# Patient Record
Sex: Female | Born: 1945 | Race: White | Hispanic: No | Marital: Married | Smoking: Never smoker
Health system: Southern US, Community
[De-identification: ages and names within clinical notes are randomized; demographics above are authoritative.]

## PROBLEM LIST (undated history)

## (undated) DIAGNOSIS — M779 Enthesopathy, unspecified: Secondary | ICD-10-CM

## (undated) DIAGNOSIS — F419 Anxiety disorder, unspecified: Secondary | ICD-10-CM

## (undated) DIAGNOSIS — F329 Major depressive disorder, single episode, unspecified: Secondary | ICD-10-CM

## (undated) DIAGNOSIS — F32A Depression, unspecified: Secondary | ICD-10-CM

## (undated) DIAGNOSIS — E785 Hyperlipidemia, unspecified: Secondary | ICD-10-CM

## (undated) HISTORY — DX: Enthesopathy, unspecified: M77.9

## (undated) HISTORY — DX: Hyperlipidemia, unspecified: E78.5

## (undated) HISTORY — DX: Anxiety disorder, unspecified: F41.9

## (undated) HISTORY — DX: Depression, unspecified: F32.A

## (undated) HISTORY — DX: Major depressive disorder, single episode, unspecified: F32.9

---

## 2013-09-23 ENCOUNTER — Encounter: Payer: Self-pay | Admitting: Family Medicine

## 2013-09-23 ENCOUNTER — Ambulatory Visit (INDEPENDENT_AMBULATORY_CARE_PROVIDER_SITE_OTHER): Payer: Medicare Other | Admitting: Family Medicine

## 2013-09-23 VITALS — BP 137/86 | HR 77 | Ht 66.0 in | Wt 215.0 lb

## 2013-09-23 DIAGNOSIS — Z79899 Other long term (current) drug therapy: Secondary | ICD-10-CM | POA: Diagnosis not present

## 2013-09-23 DIAGNOSIS — M7662 Achilles tendinitis, left leg: Secondary | ICD-10-CM | POA: Insufficient documentation

## 2013-09-23 DIAGNOSIS — F411 Generalized anxiety disorder: Secondary | ICD-10-CM

## 2013-09-23 DIAGNOSIS — E785 Hyperlipidemia, unspecified: Secondary | ICD-10-CM | POA: Insufficient documentation

## 2013-09-23 DIAGNOSIS — Z5181 Encounter for therapeutic drug level monitoring: Secondary | ICD-10-CM | POA: Diagnosis not present

## 2013-09-23 DIAGNOSIS — M25569 Pain in unspecified knee: Secondary | ICD-10-CM

## 2013-09-23 DIAGNOSIS — F419 Anxiety disorder, unspecified: Secondary | ICD-10-CM

## 2013-09-23 DIAGNOSIS — M7660 Achilles tendinitis, unspecified leg: Secondary | ICD-10-CM | POA: Diagnosis not present

## 2013-09-23 DIAGNOSIS — M25561 Pain in right knee: Secondary | ICD-10-CM | POA: Insufficient documentation

## 2013-09-23 HISTORY — DX: Anxiety disorder, unspecified: F41.9

## 2013-09-23 HISTORY — DX: Hyperlipidemia, unspecified: E78.5

## 2013-09-23 MED ORDER — ALPRAZOLAM 0.25 MG PO TABS: 0.2500 mg | ORAL_TABLET | Freq: Two times a day (BID) | ORAL | Status: DC | PRN

## 2013-09-23 MED ORDER — SIMVASTATIN 40 MG PO TABS: 20.0000 mg | ORAL_TABLET | Freq: Every day | ORAL | Status: DC

## 2013-09-23 MED ORDER — PAROXETINE HCL 20 MG PO TABS: 20.0000 mg | ORAL_TABLET | Freq: Every day | ORAL | Status: DC

## 2013-09-23 NOTE — Progress Notes (Signed)
CC: Candace Williams is a 68 y.o. female is here for Establish Care and Medication Management   Subjective: HPI:  Very pleasant 68 year old here to establish care  Patient reports a history of severe anxiety that has been present for matter of years. She believes it's been extremely well controlled since starting Paxil years ago.  She will occasionally take a single dose of alprazolam if she is going out into large crowds, cooking for a party, or around the holidays. She denies any history of depression. She denies any thoughts wanting to harm herself or others while on the above medication.  She denies any difficulty with sleeping or any other mental disturbance other than the above.  She reports a history of hyperlipidemia has been on Zocor for years. She denies any known intolerance or side effects specifically myalgias or right upper quadrant pain.  It's been well over a year since cholesterol and liver enzymes were checked last per report  Complains of left heel pain that has been present for matter of months. Accompanied by swelling and tenderness to palpation behind the heel. It is worse the longer she is on her feet. It is improved with Aleve. Symptoms come and go to a mild to moderate degree on a weekly basis. No interventions as of yet denies any recent history of trauma  Complains of right knee pain it has been present for matter of years. Is described as stiffness and pain in is nonradiating mild to moderate in severity. Present on a daily basis. It is worse after periods of inactivity and slightly improves with activity. It is relieved 100% by Aleve. Is accompanied by mild swelling on the medial aspect of the knee. She denies any catching locking or giving way   Review of Systems - General ROS: negative for - chills, fever, night sweats, weight gain or weight loss Ophthalmic ROS: negative for - decreased vision Psychological ROS: negative for -  depression ENT ROS: negative for -  hearing change, nasal congestion, tinnitus or allergies Hematological and Lymphatic ROS: negative for - bleeding problems, bruising or swollen lymph nodes Breast ROS: negative Respiratory ROS: no cough, shortness of breath, or wheezing Cardiovascular ROS: no chest pain or dyspnea on exertion Gastrointestinal ROS: no abdominal pain, change in bowel habits, or black or bloody stools Genito-Urinary ROS: negative for - genital discharge, genital ulcers, incontinence or abnormal bleeding from genitals Musculoskeletal ROS: negative for - joint pain or muscle pain other than that described above Neurological ROS: negative for - headaches or memory loss Dermatological ROS: negative for lumps, mole changes, rash and skin lesion changes  Past Medical History  Diagnosis Date  . Anxiety 09/23/2013  . Hyperlipidemia 09/23/2013    No past surgical history on file. No family history on file.  History   Social History  . Marital Status: Married    Spouse Name: N/A    Number of Children: N/A  . Years of Education: N/A   Occupational History  . Not on file.   Social History Main Topics  . Smoking status: Not on file  . Smokeless tobacco: Not on file  . Alcohol Use: Not on file  . Drug Use: Not on file  . Sexual Activity: Not on file   Other Topics Concern  . Not on file   Social History Narrative  . No narrative on file     Objective: BP 137/86  Pulse 77  Ht 5\' 6"  (1.676 m)  Wt 215 lb (97.523 kg)  BMI  34.72 kg/m2  General: Alert and Oriented, No Acute Distress HEENT: Pupils equal, round, reactive to light. Conjunctivae clear.  Moist mucous membranes pharynx unremarkable Lungs: Clear to auscultation bilaterally, no wheezing/ronchi/rales.  Comfortable work of breathing. Good air movement. Cardiac: Regular rate and rhythm. Normal S1/S2.  No murmurs, rubs, nor gallops.   Extremities: No peripheral edema.  Strong peripheral pulses. Right knee exam shows full-strength and range of motion.  There is no swelling, redness, nor warmth overlying the knee.  No patellar crepitus. No patellar apprehension. No pain with palpation of the inferior patellar pole.  No pain or laxity with valgus nor varus stress. Anterior drawer is negative.No popliteal space tenderness or palpable mass. No lateral joint line tenderness to palpation, medial joint line tenderness to palpation reproduces patient's pain. Left ankle has full range of motion and strength to all planes of motion, there is moderate bony hypertrophy at the insertion of the Achilles tendon, palpation of dislocation reproduces her presenting pain. No pain with palpation of medial or lateral malleoli nor palpation of the Achilles tendon itself Mental Status: No depression, anxiety, nor agitation. Skin: Warm and dry.  Assessment & Plan: Candace Williams was seen today for establish care and medication management.  Diagnoses and associated orders for this visit:  Hyperlipidemia - Lipid panel - simvastatin (ZOCOR) 40 MG tablet; Take 0.5 tablets (20 mg total) by mouth at bedtime.  Anxiety - PARoxetine (PAXIL) 20 MG tablet; Take 1 tablet (20 mg total) by mouth daily. - ALPRAZolam (XANAX) 0.25 MG tablet; Take 1 tablet (0.25 mg total) by mouth 2 (two) times daily as needed for anxiety.  Encounter for monitoring statin therapy - Lipid panel - COMPLETE METABOLIC PANEL WITH GFR  Left Achilles tendinitis  Right knee pain    Hyperlipidemia: Clinically controlled due for lipid panel and liver enzymes Anxiety: Controlled continue Paxil and as needed Xanax Left Achilles tendinitis, discussed wearing loose fitting comfortable shoes, start some rehabilitation plan to be performed on a daily basis for the next month. Handout was provided. Offered meloxicam the patient politely declines she'll continue to use Aleve Right knee pain suggestive of osteoarthritis, offered meloxicam and politely declines and will continue on Aleve. Discussed that she could  see my partner in sports medicine for consideration of knee injection anytime in the future, she prefer to just treat with Aleve at this time.  Return in about 6 months (around 03/25/2014).

## 2013-09-24 LAB — COMPLETE METABOLIC PANEL WITH GFR
ALT: 11 U/L (ref 0–35)
AST: 15 U/L (ref 0–37)
Albumin: 4.4 g/dL (ref 3.5–5.2)
Alkaline Phosphatase: 57 U/L (ref 39–117)
BILIRUBIN TOTAL: 0.7 mg/dL (ref 0.2–1.2)
BUN: 14 mg/dL (ref 6–23)
CO2: 28 meq/L (ref 19–32)
Calcium: 9.6 mg/dL (ref 8.4–10.5)
Chloride: 102 mEq/L (ref 96–112)
Creat: 0.73 mg/dL (ref 0.50–1.10)
GFR, EST NON AFRICAN AMERICAN: 87 mL/min
GLUCOSE: 95 mg/dL (ref 70–99)
Potassium: 4.7 mEq/L (ref 3.5–5.3)
SODIUM: 141 meq/L (ref 135–145)
TOTAL PROTEIN: 7.4 g/dL (ref 6.0–8.3)

## 2013-09-24 LAB — LIPID PANEL
CHOLESTEROL: 173 mg/dL (ref 0–200)
HDL: 48 mg/dL (ref >39)
LDL Cholesterol: 97 mg/dL (ref 0–99)
Total CHOL/HDL Ratio: 3.6 Ratio
Triglycerides: 142 mg/dL (ref <150)
VLDL: 28 mg/dL (ref 0–40)

## 2014-09-12 ENCOUNTER — Other Ambulatory Visit: Payer: Self-pay | Admitting: Family Medicine

## 2014-09-12 NOTE — Telephone Encounter (Signed)
Patient needs appointment with Provider for any further refills. 

## 2014-09-22 ENCOUNTER — Other Ambulatory Visit: Payer: Self-pay | Admitting: *Deleted

## 2014-09-22 DIAGNOSIS — E785 Hyperlipidemia, unspecified: Secondary | ICD-10-CM

## 2014-09-22 DIAGNOSIS — F419 Anxiety disorder, unspecified: Secondary | ICD-10-CM

## 2014-09-22 MED ORDER — SIMVASTATIN 40 MG PO TABS: 20.0000 mg | ORAL_TABLET | Freq: Every day | ORAL | Status: DC

## 2014-09-22 MED ORDER — PAROXETINE HCL 20 MG PO TABS: 20.0000 mg | ORAL_TABLET | Freq: Every day | ORAL | Status: DC

## 2014-10-27 ENCOUNTER — Encounter: Payer: Self-pay | Admitting: Family Medicine

## 2014-10-27 ENCOUNTER — Ambulatory Visit (INDEPENDENT_AMBULATORY_CARE_PROVIDER_SITE_OTHER): Payer: Medicare Other | Admitting: Family Medicine

## 2014-10-27 VITALS — BP 150/84 | HR 65 | Wt 220.0 lb

## 2014-10-27 DIAGNOSIS — I10 Essential (primary) hypertension: Secondary | ICD-10-CM | POA: Diagnosis not present

## 2014-10-27 DIAGNOSIS — F419 Anxiety disorder, unspecified: Secondary | ICD-10-CM | POA: Diagnosis not present

## 2014-10-27 DIAGNOSIS — E785 Hyperlipidemia, unspecified: Secondary | ICD-10-CM | POA: Diagnosis not present

## 2014-10-27 LAB — COMPLETE METABOLIC PANEL WITH GFR
ALK PHOS: 51 U/L (ref 33–130)
ALT: 13 U/L (ref 6–29)
AST: 15 U/L (ref 10–35)
Albumin: 4.4 g/dL (ref 3.6–5.1)
BILIRUBIN TOTAL: 0.6 mg/dL (ref 0.2–1.2)
BUN: 13 mg/dL (ref 7–25)
CO2: 27 mmol/L (ref 20–31)
Calcium: 9.5 mg/dL (ref 8.6–10.4)
Chloride: 102 mmol/L (ref 98–110)
Creat: 0.76 mg/dL (ref 0.50–0.99)
GFR, EST NON AFRICAN AMERICAN: 82 mL/min (ref >=60)
GFR, Est African American: >89 mL/min (ref >=60)
Glucose, Bld: 98 mg/dL (ref 65–99)
POTASSIUM: 4.4 mmol/L (ref 3.5–5.3)
SODIUM: 142 mmol/L (ref 135–146)
TOTAL PROTEIN: 7.1 g/dL (ref 6.1–8.1)

## 2014-10-27 LAB — LIPID PANEL
CHOL/HDL RATIO: 3 ratio (ref <=5.0)
CHOLESTEROL: 154 mg/dL (ref 125–200)
HDL: 52 mg/dL (ref >=46)
LDL CALC: 70 mg/dL (ref <130)
Triglycerides: 160 mg/dL — ABNORMAL HIGH (ref <150)
VLDL: 32 mg/dL — AB (ref <30)

## 2014-10-27 MED ORDER — ALPRAZOLAM 0.25 MG PO TABS: 0.2500 mg | ORAL_TABLET | Freq: Two times a day (BID) | ORAL | Status: DC | PRN

## 2014-10-27 MED ORDER — PAROXETINE HCL 20 MG PO TABS: 20.0000 mg | ORAL_TABLET | Freq: Every day | ORAL | Status: DC

## 2014-10-27 NOTE — Patient Instructions (Signed)
Buy a blood pressure cuff and if BP remains above 140/90 follow up as soon as possible.  Reduce salt intake as much as possible.

## 2014-10-27 NOTE — Progress Notes (Signed)
CC: Candace Williams is a 69 y.o. female is here for f/u chol and Medication Refill   Subjective: HPI:  Follow-up hyperlipidemia: Continues to take Zocor on a daily basis. No right upper quadrant pain or myalgias. Denies chest pain shortness breath orthopnea nor peripheral edema no formal exercise routine.  Follow-up anxiety: Continues to take Paxil a daily basis. A few days a month she will have to take Xanax if she is particularly anxious. Symptoms are worsened by having the planned major event such as a wedding or wedding receptions. Fortunately she does not have to do this in the future now that her daughter is married. Denies any depression. Overall she is happy with her current anti-anxiety regimen. No thoughts of harm self or others or depression. Denies any other mental disturbance  Blood pressure today is stage I hypertension range. She had pizza last night and does not monitor any sodium in her diet. She tells me she also add salt to some meals. She's never had elevated blood pressures in the past other than prehypertensive reading when I saw her last year.   Review Of Systems Outlined In HPI  Past Medical History  Diagnosis Date  . Anxiety 09/23/2013  . Hyperlipidemia 09/23/2013  . Depression     No past surgical history on file. Family History  Problem Relation Age of Onset  . Throat cancer Father     deceased  . Depression    . Hypertension Mother   . Thyroid disease Mother     History   Social History  . Marital Status: Married    Spouse Name: N/A  . Number of Children: N/A  . Years of Education: N/A   Occupational History  . Not on file.   Social History Main Topics  . Smoking status: Never Smoker   . Smokeless tobacco: Not on file  . Alcohol Use: 0.5 oz/week    1 drink(s) per week  . Drug Use: Not on file  . Sexual Activity: Yes   Other Topics Concern  . Not on file   Social History Narrative     Objective: BP 150/84 mmHg  Pulse 65  Wt 220 lb  (99.791 kg)  General: Alert and Oriented, No Acute Distress HEENT: Pupils equal, round, reactive to light. Conjunctivae clear.  Moist mucous membranes pharynx unremarkable Lungs: Clear to auscultation bilaterally, no wheezing/ronchi/rales.  Comfortable work of breathing. Good air movement. Cardiac: Regular rate and rhythm. Normal S1/S2.  No murmurs, rubs, nor gallops.   Extremities: No peripheral edema.  Strong peripheral pulses.  Mental Status: No depression, anxiety, nor agitation. Skin: Warm and dry.  Assessment & Plan: Candace Williams was seen today for f/u chol and medication refill.  Diagnoses and all orders for this visit:  Hyperlipidemia Orders: -     Lipid panel -     COMPLETE METABOLIC PANEL WITH GFR  Anxiety Orders: -     ALPRAZolam (XANAX) 0.25 MG tablet; Take 1 tablet (0.25 mg total) by mouth 2 (two) times daily as needed for anxiety. -     PARoxetine (PAXIL) 20 MG tablet; Take 1 tablet (20 mg total) by mouth daily.  Essential hypertension   Hyperlipidemia: Clinically controlled due for repeat lipid panel, continue simvastatin pending lipid panel and liver function Anxiety: Controlled continue Paxil and as needed alprazolam Essential hypertension: Discussed new diagnosis with her today and the importance of sodium restriction. Encouraged to also buy a blood pressure cuff and check blood pressures at home. Follow up as soon  as possible if blood pressures remain above 140/90  Return for 1-3 months for CPE.

## 2014-10-28 ENCOUNTER — Telehealth: Payer: Self-pay | Admitting: Family Medicine

## 2014-10-28 DIAGNOSIS — E785 Hyperlipidemia, unspecified: Secondary | ICD-10-CM

## 2014-10-28 MED ORDER — SIMVASTATIN 40 MG PO TABS: 20.0000 mg | ORAL_TABLET | Freq: Every day | ORAL | Status: DC

## 2014-10-28 NOTE — Telephone Encounter (Signed)
Sue Lush, Will you please let patient know that her LDL cholesterol remains well controlled therefore I'll send refills of her simvastatin to her [facility].  Triglycerides were barely elevated. This can be improved with increasing fish and whole grains in the diet.  I'd recommend f/u in six months.

## 2014-10-28 NOTE — Telephone Encounter (Signed)
Pt.notified

## 2015-06-05 ENCOUNTER — Encounter: Payer: Self-pay | Admitting: *Deleted

## 2015-07-07 ENCOUNTER — Other Ambulatory Visit: Payer: Self-pay | Admitting: Family Medicine

## 2015-07-13 ENCOUNTER — Other Ambulatory Visit: Payer: Self-pay

## 2015-07-13 MED ORDER — SIMVASTATIN 40 MG PO TABS: 20.0000 mg | ORAL_TABLET | Freq: Every day | ORAL | Status: DC

## 2015-07-14 ENCOUNTER — Telehealth: Payer: Self-pay | Admitting: Family Medicine

## 2015-07-14 NOTE — Telephone Encounter (Signed)
Called pt and left a message for pt to call and schedule f/u on Cholesterol and meds

## 2015-10-16 ENCOUNTER — Encounter: Payer: Self-pay | Admitting: Family Medicine

## 2015-10-16 ENCOUNTER — Ambulatory Visit (INDEPENDENT_AMBULATORY_CARE_PROVIDER_SITE_OTHER): Payer: Medicare Other | Admitting: Family Medicine

## 2015-10-16 VITALS — BP 157/83 | HR 71 | Wt 222.0 lb

## 2015-10-16 DIAGNOSIS — R030 Elevated blood-pressure reading, without diagnosis of hypertension: Secondary | ICD-10-CM | POA: Diagnosis not present

## 2015-10-16 DIAGNOSIS — F419 Anxiety disorder, unspecified: Secondary | ICD-10-CM | POA: Diagnosis not present

## 2015-10-16 DIAGNOSIS — IMO0001 Reserved for inherently not codable concepts without codable children: Secondary | ICD-10-CM

## 2015-10-16 DIAGNOSIS — E785 Hyperlipidemia, unspecified: Secondary | ICD-10-CM | POA: Diagnosis not present

## 2015-10-16 MED ORDER — PAROXETINE HCL 20 MG PO TABS: 20.0000 mg | ORAL_TABLET | Freq: Every day | ORAL | Status: DC

## 2015-10-16 MED ORDER — ALPRAZOLAM 0.25 MG PO TABS: 0.2500 mg | ORAL_TABLET | Freq: Two times a day (BID) | ORAL | Status: DC | PRN

## 2015-10-16 NOTE — Progress Notes (Signed)
CC: Jerlean Peralta is a 70 y.o. female is here for Medication Refill   Subjective: HPI:  Follow-up anxiety: She takes alprazolam 1-2 times a day. She is taking Paxil on a daily basis. She tells me she is anxious about coming to the doctor's office but overall her anxiety is controlled outside of our office. She denies any depression or any other mental disturbance.    Follow-up hyperlipidemia: She is requesting a refill on Zocor. She denies any right upper quadrant pain or myalgias. No chest pain shortness of breath orthopnea nor peripheral edema.  She would like some guidance on what vitamins she should take for general health.   Review Of Systems Outlined In HPI  Past Medical History  Diagnosis Date  . Anxiety 09/23/2013  . Hyperlipidemia 09/23/2013  . Depression     No past surgical history on file. Family History  Problem Relation Age of Onset  . Throat cancer Father     deceased  . Depression    . Hypertension Mother   . Thyroid disease Mother     Social History   Social History  . Marital Status: Married    Spouse Name: N/A  . Number of Children: N/A  . Years of Education: N/A   Occupational History  . Not on file.   Social History Main Topics  . Smoking status: Never Smoker   . Smokeless tobacco: Not on file  . Alcohol Use: 0.5 oz/week    1 drink(s) per week  . Drug Use: Not on file  . Sexual Activity: Yes   Other Topics Concern  . Not on file   Social History Narrative     Objective: BP 157/83 mmHg  Pulse 71  Wt 222 lb (100.699 kg)  General: Alert and Oriented, No Acute Distress HEENT: Pupils equal, round, reactive to light. Conjunctivae clear.  Moist mucous membranes Lungs: Clear to auscultation bilaterally, no wheezing/ronchi/rales.  Comfortable work of breathing. Good air movement. Cardiac: Regular rate and rhythm. Normal S1/S2.  No murmurs, rubs, nor gallops.   Extremities: No peripheral edema.  Strong peripheral pulses.  Mental Status:  No depression, anxiety, nor agitation. Skin: Warm and dry.  Assessment & Plan: Nohelia was seen today for medication refill.  Diagnoses and all orders for this visit:  White coat hypertension  Hyperlipidemia -     Lipid panel  Anxiety -     ALPRAZolam (XANAX) 0.25 MG tablet; Take 1 tablet (0.25 mg total) by mouth 2 (two) times daily as needed for anxiety. -     PARoxetine (PAXIL) 20 MG tablet; Take 1 tablet (20 mg total) by mouth daily.  Other orders -     Cancel: simvastatin (ZOCOR) 40 MG tablet; Take 0.5 tablets (20 mg total) by mouth at bedtime. NEED FOLLOW UP APPOINTMENT FOR MORE REFILLS   Hyperlipidemia: Due for lipid panel refills on Zocor will be needed based on results. Anxiety: Controlled with fluoxetine and Xanax. Whitecoat hypertension: Blood pressures have been normal at home we will consider this whitecoat hypertension and less pressures rise above 140/90 at home.  25 minutes spent face-to-face during visit today of which at least 50% was counseling or coordinating care regarding: 1. White coat hypertension   2. Hyperlipidemia   3. Anxiety      Discussed with this patient that I will be resigning from my position here with [facility] in September in order to stay with my family who will be moving to [facility]. I let him know  about the providers that are still accepting patients and I feel that this individual will be under great care if he/she stays here with [facility].   Return in about 3 months (around 01/16/2016) for Physical with Dr. Lyn HollingsheadAlexander.

## 2015-10-17 LAB — LIPID PANEL
Cholesterol: 163 mg/dL (ref 125–200)
HDL: 50 mg/dL (ref >=46)
LDL CALC: 87 mg/dL (ref <130)
Total CHOL/HDL Ratio: 3.3 Ratio (ref <=5.0)
Triglycerides: 128 mg/dL (ref <150)
VLDL: 26 mg/dL (ref <30)

## 2015-10-19 ENCOUNTER — Telehealth: Payer: Self-pay | Admitting: Family Medicine

## 2015-10-19 MED ORDER — SIMVASTATIN 40 MG PO TABS: 20.0000 mg | ORAL_TABLET | Freq: Every day | ORAL | 1 refills | Status: DC

## 2015-10-19 NOTE — Telephone Encounter (Signed)
Pt notified of recommendations

## 2015-10-19 NOTE — Telephone Encounter (Signed)
Will you please let patient know that her cholesterol is well-controlled with her current dose of simvastatin. I sent a refill to [facility] and would recommend follow-up in 6 months.

## 2016-04-22 ENCOUNTER — Ambulatory Visit (INDEPENDENT_AMBULATORY_CARE_PROVIDER_SITE_OTHER): Payer: Medicare Other | Admitting: Osteopathic Medicine

## 2016-04-22 ENCOUNTER — Encounter: Payer: Self-pay | Admitting: Osteopathic Medicine

## 2016-04-22 VITALS — BP 158/89 | HR 70 | Ht 64.0 in | Wt 223.0 lb

## 2016-04-22 DIAGNOSIS — F419 Anxiety disorder, unspecified: Secondary | ICD-10-CM | POA: Diagnosis not present

## 2016-04-22 DIAGNOSIS — E785 Hyperlipidemia, unspecified: Secondary | ICD-10-CM

## 2016-04-22 DIAGNOSIS — R030 Elevated blood-pressure reading, without diagnosis of hypertension: Secondary | ICD-10-CM

## 2016-04-22 DIAGNOSIS — Z23 Encounter for immunization: Secondary | ICD-10-CM

## 2016-04-22 MED ORDER — ZOSTER VAC RECOMB ADJUVANTED 50 MCG/0.5ML IM SUSR: 0.5000 mL | Freq: Once | INTRAMUSCULAR | 1 refills | Status: AC

## 2016-04-22 MED ORDER — PAROXETINE HCL 20 MG PO TABS: 20.0000 mg | ORAL_TABLET | Freq: Every day | ORAL | 3 refills | Status: DC

## 2016-04-22 NOTE — Progress Notes (Signed)
HPI: Candace Williams is a 71 y.o. female  who presents to [facility] today, 04/22/16,  for chief complaint of:  Chief Complaint  Patient presents with  . Establish Care    Previous PCP has left the practice, patient is here to establish care. No complaints today. Needs refills on some medications.  Anxiety: Has been doing well on Paxil for many years. Has some questions about whether or not this is something that she can or should discontinue at some point. See below for anxiety and depression self reporting skills.  Hypertension: History of hypertension on problem list. White coat hypertension more likely. No chest pain or  pressure, no shortness of breath or dizziness. Unknown if her when home blood pressure cuff has been verified.  Hyperlipidemia: Has sufficient amount of simvastatin. Lipid panel last John 10/16/2015, excellently 6 months ago  Past medical history, surgical history, social history and family history reviewed.  Patient Active Problem List   Diagnosis Date Noted  . White coat hypertension 10/16/2015  . Essential hypertension 10/27/2014  . Hyperlipidemia 09/23/2013  . Anxiety 09/23/2013  . Left Achilles tendinitis 09/23/2013  . Right knee pain 09/23/2013    Current medication list and allergy/intolerance information reviewed.   Current Outpatient Prescriptions on File Prior to Visit  Medication Sig Dispense Refill  . ALPRAZolam (XANAX) 0.25 MG tablet Take 1 tablet (0.25 mg total) by mouth 2 (two) times daily as needed for anxiety. 60 tablet 2  . PARoxetine (PAXIL) 20 MG tablet Take 1 tablet (20 mg total) by mouth daily. 90 tablet 1  . simvastatin (ZOCOR) 40 MG tablet Take 0.5 tablets (20 mg total) by mouth at bedtime. 90 tablet 1   No current facility-administered medications on file prior to visit.    No Known Allergies    Review of Systems:  Constitutional: No recent illness  HEENT: No  headache, no vision  change  Cardiac: No  chest pain, No  pressure, No palpitations  Respiratory:  No  shortness of breath. No  Cough  Gastrointestinal: No  abdominal pain, no change on bowel habits  Musculoskeletal: No new myalgia/arthralgia  Skin: No  Rash  Hem/Onc: No  easy bruising/bleeding, No  abnormal lumps/bumps  Neurologic: No  weakness, No  Dizziness  Psychiatric: No  concerns with depression, No  concerns with anxiety  Exam:  BP (!) 158/89   Pulse 70   Ht 5\' 4"  (1.626 m)   Wt 223 lb (101.2 kg)   BMI 38.28 kg/m   Constitutional: VS see above. General Appearance: alert, well-developed, well-nourished, NAD  Eyes: Normal lids and conjunctive, non-icteric sclera  Ears, Nose, Mouth, Throat: MMM, Normal external inspection ears/nares/mouth/lips/gums.  Neck: No masses, trachea midline.   Respiratory: Normal respiratory effort. no wheeze, no rhonchi, no rales  Cardiovascular: S1/S2 normal, no murmur, no rub/gallop auscultated. RRR.   Musculoskeletal: Gait normal. Symmetric and independent movement of all extremities  Neurological: Normal balance/coordination. No tremor.  Skin: warm, dry, intact.   Psychiatric: Normal judgment/insight. Normal mood and affect. Oriented x3.      GAD 7 : Generalized Anxiety Score 04/22/2016  Nervous, Anxious, on Edge 1  Control/stop worrying 1  Worry too much - different things 0  Trouble relaxing 0  Restless 0  Easily annoyed or irritable 2  Afraid - awful might happen 2  Total GAD 7 Score 6    Depression screen PHQ 2/9 04/22/2016  Decreased Interest 2  Down, Depressed, Hopeless 1  PHQ -  2 Score 3  Altered sleeping 2  Tired, decreased energy 2  Change in appetite 0  Feeling bad or failure about yourself  3  Trouble concentrating 0  Moving slowly or fidgety/restless 0  Suicidal thoughts 0  PHQ-9 Score 10      ASSESSMENT/PLAN:   Advised if doing better on SSRI than not, would advise continuation of this in light of overall risks  versus benefits, of course discontinuation is possible if this is her goal but if so would recommend slow taper under medical supervision. Patient verbalizes understanding and would like to stay on the medication for now  Anxiety - Plan: PARoxetine (PAXIL) 20 MG tablet  Hyperlipidemia, unspecified hyperlipidemia type  Need for shingles vaccine - Plan: Zoster Vac Recomb Adjuvanted (SHINGRIX) 50 MCG SUSR  White coat syndrome without diagnosis of hypertension      Follow-up plan: Return in about 6 months (around 10/20/2016) for [facility] VISIT - call the week ahead for labs/blood draw.  Visit summary with medication list and pertinent instructions was printed for patient to review, alert [place] if any changes needed. All questions at time of visit were answered - patient instructed to contact office with any additional concerns. ER/RTC precautions were reviewed with the patient and understanding verbalized.   Note: Total time spent 25 minutes, greater than 50% of the visit was spent face-to-face counseling and coordinating care for the following: The primary encounter diagnosis was Anxiety. 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

## 2016-04-28 ENCOUNTER — Telehealth: Payer: Self-pay | Admitting: Osteopathic Medicine

## 2016-04-28 NOTE — Telephone Encounter (Signed)
LM to inform pt of AWV schduled for July 26th at 1pm, [place]

## 2016-08-24 DIAGNOSIS — M722 Plantar fascial fibromatosis: Secondary | ICD-10-CM | POA: Diagnosis not present

## 2016-08-24 DIAGNOSIS — M24571 Contracture, right ankle: Secondary | ICD-10-CM | POA: Diagnosis not present

## 2016-08-24 DIAGNOSIS — M7661 Achilles tendinitis, right leg: Secondary | ICD-10-CM | POA: Diagnosis not present

## 2016-08-24 DIAGNOSIS — M7731 Calcaneal spur, right foot: Secondary | ICD-10-CM | POA: Diagnosis not present

## 2016-08-24 DIAGNOSIS — M7751 Other enthesopathy of right foot: Secondary | ICD-10-CM | POA: Diagnosis not present

## 2016-09-16 DIAGNOSIS — M7661 Achilles tendinitis, right leg: Secondary | ICD-10-CM | POA: Diagnosis not present

## 2016-09-16 DIAGNOSIS — M722 Plantar fascial fibromatosis: Secondary | ICD-10-CM | POA: Diagnosis not present

## 2016-09-16 DIAGNOSIS — M7751 Other enthesopathy of right foot: Secondary | ICD-10-CM | POA: Diagnosis not present

## 2016-09-16 DIAGNOSIS — M24571 Contracture, right ankle: Secondary | ICD-10-CM | POA: Diagnosis not present

## 2016-09-16 DIAGNOSIS — M7731 Calcaneal spur, right foot: Secondary | ICD-10-CM | POA: Diagnosis not present

## 2016-10-19 ENCOUNTER — Other Ambulatory Visit: Payer: Self-pay

## 2016-10-19 MED ORDER — SIMVASTATIN 40 MG PO TABS: 20.0000 mg | ORAL_TABLET | Freq: Every day | ORAL | 1 refills | Status: DC

## 2016-10-19 NOTE — Telephone Encounter (Signed)
Pt established care with you back in January.  You gave her a year supply of Paxil and only a 6 months supply for simvastatin.  She is wanting know can she get a 6 months supply sent to the pharmacy.  I explained to her that she is due for a wellness visit with lab work to make sure that she is on the appropriate dose of simvastatin.  She stated that she understood that but she is not financially stable to have a doctor visit at this time. Please advise. -EH/RMA

## 2016-10-20 ENCOUNTER — Ambulatory Visit: Payer: Self-pay

## 2016-11-18 DIAGNOSIS — M24571 Contracture, right ankle: Secondary | ICD-10-CM | POA: Diagnosis not present

## 2016-11-18 DIAGNOSIS — M7751 Other enthesopathy of right foot: Secondary | ICD-10-CM | POA: Diagnosis not present

## 2016-11-18 DIAGNOSIS — M7661 Achilles tendinitis, right leg: Secondary | ICD-10-CM | POA: Diagnosis not present

## 2016-11-18 DIAGNOSIS — M722 Plantar fascial fibromatosis: Secondary | ICD-10-CM | POA: Diagnosis not present

## 2016-11-18 DIAGNOSIS — M7731 Calcaneal spur, right foot: Secondary | ICD-10-CM | POA: Diagnosis not present

## 2016-12-13 DIAGNOSIS — M25571 Pain in right ankle and joints of right foot: Secondary | ICD-10-CM | POA: Diagnosis not present

## 2016-12-15 DIAGNOSIS — M25571 Pain in right ankle and joints of right foot: Secondary | ICD-10-CM | POA: Diagnosis not present

## 2016-12-26 DIAGNOSIS — M25571 Pain in right ankle and joints of right foot: Secondary | ICD-10-CM | POA: Diagnosis not present

## 2016-12-29 DIAGNOSIS — M25571 Pain in right ankle and joints of right foot: Secondary | ICD-10-CM | POA: Diagnosis not present

## 2017-06-21 ENCOUNTER — Ambulatory Visit (INDEPENDENT_AMBULATORY_CARE_PROVIDER_SITE_OTHER): Payer: Medicare Other | Admitting: Osteopathic Medicine

## 2017-06-21 ENCOUNTER — Encounter: Payer: Self-pay | Admitting: Osteopathic Medicine

## 2017-06-21 VITALS — BP 153/87 | HR 67 | Temp 98.2°F | Wt 227.0 lb

## 2017-06-21 DIAGNOSIS — Z1211 Encounter for screening for malignant neoplasm of colon: Secondary | ICD-10-CM

## 2017-06-21 DIAGNOSIS — E7800 Pure hypercholesterolemia, unspecified: Secondary | ICD-10-CM

## 2017-06-21 DIAGNOSIS — R7301 Impaired fasting glucose: Secondary | ICD-10-CM | POA: Diagnosis not present

## 2017-06-21 DIAGNOSIS — M1990 Unspecified osteoarthritis, unspecified site: Secondary | ICD-10-CM | POA: Diagnosis not present

## 2017-06-21 DIAGNOSIS — R635 Abnormal weight gain: Secondary | ICD-10-CM

## 2017-06-21 DIAGNOSIS — F419 Anxiety disorder, unspecified: Secondary | ICD-10-CM | POA: Diagnosis not present

## 2017-06-21 DIAGNOSIS — R030 Elevated blood-pressure reading, without diagnosis of hypertension: Secondary | ICD-10-CM

## 2017-06-21 MED ORDER — ALPRAZOLAM 0.25 MG PO TABS: 0.2500 mg | ORAL_TABLET | Freq: Two times a day (BID) | ORAL | 0 refills | Status: DC | PRN

## 2017-06-21 MED ORDER — PAROXETINE HCL 20 MG PO TABS: 20.0000 mg | ORAL_TABLET | Freq: Every day | ORAL | 3 refills | Status: DC

## 2017-06-21 MED ORDER — MELOXICAM 15 MG PO TABS: 7.5000 mg | ORAL_TABLET | Freq: Every day | ORAL | 3 refills | Status: DC

## 2017-06-21 NOTE — Progress Notes (Signed)
HPI: Candace Williams is a 72 y.o. female who  has a past medical history of Anxiety (09/23/2013), Depression, and Hyperlipidemia (09/23/2013).  Cand[facility] today, 06/21/17,  for chief complaint of:  Anxiety  Sopped her medications and is having some mood problems. Noticing significant increased irritability, difficulty with sleep.  White coat syndrome: Blood pressure is consistently high in office. Patient reports like to get back on antianxiety medications and see if it improves.  Concerned about weight gain, has questions about cardiac risk factors and statin medications.  Concerned about pain in right heel/Achilles tendon area. Chronic issue, Candace Williams states previous x-rays have showed bone spurs. No pain in plantar fascia area. No recent injury.    Past medical history, surgical history, social history and family history reviewed. No updates needed.   Current medication list and allergy/intolerance information reviewed.    Current Outpatient Medications on File Prior to Visit  Medication Sig Dispense Refill  . ALPRAZolam (XANAX) 0.25 MG tablet Take 1 tablet (0.25 mg total) by mouth 2 (two) times daily as needed for anxiety. 60 tablet 2  . simvastatin (ZOCOR) 40 MG tablet Take 0.5 tablets (20 mg total) by mouth at bedtime. 90 tablet 1  . PARoxetine (PAXIL) 20 MG tablet Take 1 tablet (20 mg total) by mouth daily. (Patient not taking: Reported on 06/21/2017) 90 tablet 3   No current facility-administered medications on file prior to visit.    No Known Allergies    Review of Systems:  Constitutional: No recent illness  HEENT: No  headache, no vision change  Cardiac: No  chest pain, No  pressure, No palpitations  Respiratory:  No  shortness of breath. No  Cough  Gastrointestinal: No  abdominal pain, no change on bowel habits  Musculoskeletal: +new myalgia/arthralgia  Skin: No  Rash  Hem/Onc: No  easy bruising/bleeding, No   abnormal lumps/bumps  Neurologic: No  weakness, No  Dizziness  Psychiatric: +concerns with depression, +concerns with anxiety  Exam:  BP (!) 153/87 (BP Location: Left Arm, Patient Position: Sitting, Cuff Size: Normal)   Pulse 67   Temp 98.2 F (36.8 C) (Oral)   Wt 227 lb (103 kg)   BMI 38.96 kg/m   Constitutional: VS see above. General Appearance: alert, well-developed, well-nourished, NAD  Eyes: Normal lids and conjunctive, non-icteric sclera  Ears, Nose, Mouth, Throat: MMM, Normal external inspection ears/nares/mouth/lips/gums.  Neck: No masses, trachea midline.   Respiratory: Normal respiratory effort. no wheeze, no rhonchi, no rales  Cardiovascular: S1/S2 normal, no murmur, no rub/gallop auscultated. RRR.   Musculoskeletal: Gait normal. Symmetric and independent movement of all extremities. No LE edema   Neurological: Normal balance/coordination. No tremor.  Skin: warm, dry, intact.   Psychiatric: Normal judgment/insight. Normal mood and affect. Oriented x3.     ASSESSMENT/PLAN:   Anxiety - Plan: PARoxetine (PAXIL) 20 MG tablet, CBC, COMPLETE METABOLIC PANEL WITH GFR, ALPRAZolam (XANAX) 0.25 MG tablet  White coat syndrome without diagnosis of hypertension - Plan: CBC, COMPLETE METABOLIC PANEL WITH GFR, TSH  Arthritis  Weight gain - Plan: TSH  High cholesterol - Plan: Lipid panel  Colon cancer screening - Plan: Cologuard   Meds ordered this encounter  Medications  . PARoxetine (PAXIL) 20 MG tablet    Sig: Take 1 tablet (20 mg total) by mouth daily.    Dispense:  90 tablet    Refill:  3  . meloxicam (MOBIC) 15 MG tablet    Sig: Take 0.5-1 tablets (7.5-15  mg total) by mouth daily. As needed for aches/pain    Dispense:  30 tablet    Refill:  3  . ALPRAZolam (XANAX) 0.25 MG tablet    Sig: Take 1 tablet (0.25 mg total) by mouth 2 (two) times daily as needed for anxiety.    Dispense:  30 tablet    Refill:  0    Patient Instructions  Plan:  See  printed exercises for achilles/heel. If aches/pains are not better or if it gets worse, I would recommend follow-up with one of our sports medicine specialists (Dr Denyse Amass or Dr. Cherylann Parr Dr. Karie Schwalbe) for further evaluation in 2-4 weeks. Just call our office and ask for an appointment for sports medicine! Try the anti-inflammatory medication as needed   Let's restart the Paxil  Let's get blood work - we can talk more about your risk of heart issues or stroke once we have these results.      Follow-up plan: Return in about 1 month (around 07/19/2017) for follow-up on anxiety, review lab results, recheck BP .  Visit summary with medication list and pertinent instructions was printed for patient to review, alert [place] if any changes needed. All questions at time of visit were answered - patient instructed to contact office with any additional concerns. ER/RTC precautions were reviewed with the patient and understanding verbalized.   Note: Total time spent 40 minutes, greater than 50% of the visit was spent face-to-face counseling and coordinating care for the following: The primary encounter diagnosis was Anxiety. Diagnoses of White coat syndrome without diagnosis of hypertension, Arthritis, Weight gain, High cholesterol, and Colon cancer screening were also pertinent to this visit.Marland Kitchen  Please note: voice recognition software was used to produce this document, and typos may escape review. Please contact Dr. Lyn Hollingshead for any needed clarifications.

## 2017-06-21 NOTE — Patient Instructions (Addendum)
Plan:  See printed exercises for achilles/heel. If aches/pains are not better or if it gets worse, I would recommend follow-up with one of our sports medicine specialists (Dr Denyse Amassorey or Dr. Cherylann Parrhekkekandam aka Dr. Karie Schwalbe) for further evaluation in 2-4 weeks. Just call our office and ask for an appointment for sports medicine! Try the anti-inflammatory medication as needed   Let's restart the Paxil  Let's get blood work - we can talk more about your risk of heart issues or stroke once we have these results.

## 2017-06-22 ENCOUNTER — Encounter: Payer: Self-pay | Admitting: Osteopathic Medicine

## 2017-06-22 LAB — LIPID PANEL
CHOL/HDL RATIO: 3.2 (calc) (ref <5.0)
Cholesterol: 162 mg/dL (ref <200)
HDL: 50 mg/dL — AB (ref >50)
LDL Cholesterol (Calc): 88 mg/dL (calc)
NON-HDL CHOLESTEROL (CALC): 112 mg/dL (ref <130)
Triglycerides: 141 mg/dL (ref <150)

## 2017-06-22 LAB — COMPLETE METABOLIC PANEL WITH GFR
AG RATIO: 1.6 (calc) (ref 1.0–2.5)
ALT: 11 U/L (ref 6–29)
AST: 14 U/L (ref 10–35)
Albumin: 4.6 g/dL (ref 3.6–5.1)
Alkaline phosphatase (APISO): 60 U/L (ref 33–130)
BUN: 13 mg/dL (ref 7–25)
CALCIUM: 9.8 mg/dL (ref 8.6–10.4)
CO2: 30 mmol/L (ref 20–32)
Chloride: 103 mmol/L (ref 98–110)
Creat: 0.9 mg/dL (ref 0.50–0.99)
GFR, EST AFRICAN AMERICAN: 76 mL/min/{1.73_m2} (ref >=60)
GFR, EST NON AFRICAN AMERICAN: 65 mL/min/{1.73_m2} (ref >=60)
GLOBULIN: 2.9 g/dL (ref 1.9–3.7)
Glucose, Bld: 108 mg/dL — ABNORMAL HIGH (ref 65–99)
POTASSIUM: 4.1 mmol/L (ref 3.5–5.3)
Sodium: 140 mmol/L (ref 135–146)
Total Bilirubin: 0.7 mg/dL (ref 0.2–1.2)
Total Protein: 7.5 g/dL (ref 6.1–8.1)

## 2017-06-22 LAB — HEMOGLOBIN A1C W/OUT EAG: HEMOGLOBIN A1C: 5.7 %{Hb} — AB (ref <5.7)

## 2017-06-22 LAB — CBC
HEMATOCRIT: 42.9 % (ref 35.0–45.0)
HEMOGLOBIN: 14.7 g/dL (ref 11.7–15.5)
MCH: 29.3 pg (ref 27.0–33.0)
MCHC: 34.3 g/dL (ref 32.0–36.0)
MCV: 85.5 fL (ref 80.0–100.0)
MPV: 11.1 fL (ref 7.5–12.5)
Platelets: 214 10*3/uL (ref 140–400)
RBC: 5.02 10*6/uL (ref 3.80–5.10)
RDW: 13.3 % (ref 11.0–15.0)
WBC: 7.7 10*3/uL (ref 3.8–10.8)

## 2017-06-22 LAB — TSH: TSH: 2.55 m[IU]/L (ref 0.40–4.50)

## 2017-07-26 DIAGNOSIS — Z1212 Encounter for screening for malignant neoplasm of rectum: Secondary | ICD-10-CM | POA: Diagnosis not present

## 2017-07-26 DIAGNOSIS — Z1211 Encounter for screening for malignant neoplasm of colon: Secondary | ICD-10-CM | POA: Diagnosis not present

## 2017-08-09 LAB — COLOGUARD: COLOGUARD: NEGATIVE

## 2017-08-25 ENCOUNTER — Telehealth: Payer: Self-pay

## 2017-08-25 NOTE — Telephone Encounter (Signed)
I don't have any results on file, can we call Cologuard and make sure results were sent?

## 2017-08-25 NOTE — Telephone Encounter (Signed)
Contacted [facility] re: Cologuard results. As per Rep Kevin Fenton results for cologuard is negative from 08/09/17. Pt has been updated with results. No other inquiries asked during call.

## 2017-08-25 NOTE — Telephone Encounter (Signed)
Task completed. Pls review add'l telephone encounter.

## 2017-08-25 NOTE — Telephone Encounter (Signed)
Pt left vm msg requesting results for Cologuard. Pls advise, thanks.

## 2017-09-27 ENCOUNTER — Other Ambulatory Visit: Payer: Self-pay | Admitting: Osteopathic Medicine

## 2017-11-21 ENCOUNTER — Telehealth: Payer: Self-pay | Admitting: Osteopathic Medicine

## 2017-11-21 NOTE — Telephone Encounter (Signed)
Please call patient: Cologuard testing was negative.  Plan to repeat this in 3 years.  Documentation was signed and sent to scan

## 2017-11-21 NOTE — Telephone Encounter (Signed)
Pt has been updated.  

## 2017-11-21 NOTE — Telephone Encounter (Signed)
Left a brief vm msg for pt to return call back regarding results. Direct call back information provided.

## 2017-11-30 ENCOUNTER — Encounter: Payer: Self-pay | Admitting: Osteopathic Medicine

## 2018-06-08 ENCOUNTER — Other Ambulatory Visit: Payer: Self-pay | Admitting: Osteopathic Medicine

## 2018-06-08 DIAGNOSIS — F419 Anxiety disorder, unspecified: Secondary | ICD-10-CM

## 2018-06-08 NOTE — Telephone Encounter (Signed)
[facility] pharmacy requesting med refill for alprazolam, paroxetine and simvastatin.

## 2018-06-09 ENCOUNTER — Telehealth: Payer: Self-pay | Admitting: Osteopathic Medicine

## 2018-06-09 NOTE — Telephone Encounter (Signed)
I sent 30-day supply for requested medications, but it has been almost a year since she was last seen.  Needs to come in for an routine checkup sometime in the next month.

## 2018-06-11 ENCOUNTER — Telehealth: Payer: Self-pay

## 2018-06-11 NOTE — Telephone Encounter (Signed)
[facility] pharmacy requesting clarification on paroxetine rx:  "Pt states she used to take 1/2 tab daily. Dose was increased 11/2017, but no one contacted her to tell her why. Was this a mistake or is there a reason she needs to increased to one tablet daily?" Pls advise, thanks.

## 2018-06-11 NOTE — Telephone Encounter (Signed)
Pt has been updated of refills for multiple meds sent to local pharmacy. No other inquiries during call.

## 2018-06-11 NOTE — Telephone Encounter (Signed)
Pt has been updated. As per pt, she will continue to take 1/2 tab of paroxetine daily. Aware that annual follow up is needed. No other inquiries during call.

## 2018-06-11 NOTE — Telephone Encounter (Signed)
Pt advised.

## 2018-06-11 NOTE — Telephone Encounter (Signed)
Don't know  Paxil 20 mg is what's been in her chart - likely entered in error and patient did not review her med list closely to correct [place].  OK to take half Due for annual follow-up been alsmot a year since seen

## 2018-07-09 ENCOUNTER — Other Ambulatory Visit: Payer: Self-pay | Admitting: Osteopathic Medicine

## 2018-07-09 DIAGNOSIS — F419 Anxiety disorder, unspecified: Secondary | ICD-10-CM

## 2018-07-09 NOTE — Telephone Encounter (Signed)
Routing to Dr Alexander's rx refill pool 

## 2018-07-15 NOTE — Progress Notes (Signed)
Subjective:   Candace Williams is a 73 y.o. female who presents for an Initial Medicare Annual Wellness Visit.  Review of Systems    No ROS.  Medicare Wellness Visit. Additional risk factors are reflected in the social history.   Cardiac Risk Factors include: advanced age (>81men, >76 women);hypertension;dyslipidemia;sedentary lifestyle Sleep patterns: Getting 8 hpours of sleep a night. Wakes up 1 time to go to the bathroom. Wakes up feeling rested. Home Safety/Smoke Alarms: Feels safe in home. Smoke alarms in place.  Living environment; Lives with husband in a 2 story home. Stairs have hand rails on them. SHower is a walk in shower  And no grab bars in place Seat Belt Safety/Bike Helmet: Wears seat belt.   Female:   Pap- aged out      Mammo-    ordered   Dexa scan-    ordered    CCS- UTD    Objective:    Today's Vitals   07/16/18 1436  BP: 131/70  Pulse: 67  Weight: 219 lb (99.3 kg)  Height: 5\' 4"  (1.626 m)   Body mass index is 37.59 kg/m.  Advanced Directives 07/16/2018 09/23/2013  Does Patient Have a Medical Advance Directive? No Patient does not have advance directive;Patient would not like information  Would patient like information on creating a medical advance directive? No - Patient declined -    Current Medications (verified) Outpatient Encounter Medications as of 07/16/2018  Medication Sig  . ALPRAZolam (XANAX) 0.25 MG tablet Take 1 tablet by mouth twice daily as needed for anxiety  . meloxicam (MOBIC) 15 MG tablet Take 0.5-1 tablets (7.5-15 mg total) by mouth daily. As needed for aches/pain  . PARoxetine (PAXIL) 20 MG tablet Take 1 tablet by mouth once daily  . simvastatin (ZOCOR) 40 MG tablet TAKE 1 TABLET BY MOUTH ONCE DAILY AT  6  PM   No facility-administered encounter medications on file as of 07/16/2018.     Allergies (verified) Patient has no known allergies.   History: Past Medical History:  Diagnosis Date  . Anxiety 09/23/2013  . Depression    . Hyperlipidemia 09/23/2013  . Tendonitis    right foot   History reviewed. No pertinent surgical history. Family History  Problem Relation Age of Onset  . Throat cancer Father        deceased  . Depression Other   . Hypertension Mother   . Thyroid disease Mother    Social History   Socioeconomic History  . Marital status: Married    Spouse name: Candace Williams  . Number of children: 1  . Years of education: 25  . Highest education level: Bachelor's degree (e.g., BA, AB, BS)  Occupational History  . 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 Financial resource strain: Not hard at all  . Food insecurity:    Worry: Never true    Inability: Never true  . Transportation needs:    Medical: No    Non-medical: No  Tobacco Use  . Smoking status: Never Smoker  . Smokeless tobacco: Never Used  Substance and Sexual Activity  . Alcohol use: Yes    Alcohol/week: 0.0 standard drinks  . Drug use: Never  . Sexual activity: Not Currently  Lifestyle  . Physical activity:    Days per week: 0 days    Minutes per session: 0 min  . Stress: Not at all  Relationships  . Social connections:    Talks on phone: More than three  times a week    Gets together: Twice a week    Attends religious service: Never    Active member of club or organization: No    Attends meetings of clubs or organizations: Never    Relationship status: Married  Other Topics Concern  . 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 N  Vision? N  Comment had lasik surgery a long time ago  Difficulty concentrating or making decisions? N  Walking or climbing stairs? N  Dressing or bathing? N  Doing errands, shopping? N  Preparing Food and eating ? N  Using the Toilet? N  In the past six months, have you accidently leaked urine? Y  Comment does have urinary incontinence. Does empty bladder more often and this helps  Do you have problems with loss of bowel control? N  Managing your Medications? N  Managing your Finances? N  Housekeeping or managing your Housekeeping? N  Some recent data might be hidden     Immunizations and Health Maintenance  There is no immunization history on file for this patient. Health Maintenance Due  Topic Date Due  . Hepatitis C Screening  1945-05-19  . TETANUS/TDAP  01/10/1967  . MAMMOGRAM  01/09/1998  . DEXA SCAN  01/09/2013  . PNA vac Low Risk Adult (1 of 2 - PCV13) 01/09/2013    Patient Care Team: Sunnie Nielsen, DO as PCP - General (Osteopathic Medicine)  Indicate any recent Medical Services you may have received from other than [facility] providers in the past year (date may be approximate).     Assessment:   This is a routine wellness examination for [place].Physical assessment deferred to PCP.   Hearing/Vision screen Hearing Screening Comments: Hearing test not done- visit over the phone was done due to COVID19 pandemic Vision Screening Comments: Vison test not done- visit over the telephone due to the COVID19 pandemic  Dietary issues and exercise activities discussed: Current Exercise Habits: The patient does not participate in regular exercise at present, Exercise limited by: None identified Diet Eats a healthy diet with vegetables, fruits, trys to stay away from carbs. Eats a lot of  cheese Breakfast: leftovers or sandwich Lunch: combines with breakfast Dinner: Meat and 2 vegetables watches proteins portions, potato    Drinks water daily.  Goals    . Exercise 3x per week (30 min per time)     Would like to start using a stationary bike and building endurance on that.      Depression Screen PHQ 2/9 Scores 07/16/2018 04/22/2016  PHQ - 2 Score 0 3  PHQ- 9 Score - 10    Fall Risk Fall Risk  07/16/2018  Falls in the past year? 0  Follow up Falls prevention discussed    Is the patient's home free of loose throw rugs in walkways, pet beds, electrical cords,  etc?   yes      Grab bars in the bathroom? no      Handrails on the stairs?   yes      Adequate lighting?   yes  Cognitive Function:     6CIT Screen 07/16/2018  What Year? 0 points  What month? 0 points  What time? 0 points  Count back from 20 0 points  Months in reverse 0 points  Repeat phrase 0 points  Total Score 0    Screening Tests Health Maintenance  Topic Date Due  . Hepatitis C Screening  08/06/1945  . TETANUS/TDAP  01/10/1967  . MAMMOGRAM  01/09/1998  . DEXA SCAN  01/09/2013  . PNA vac Low Risk Adult (1 of 2 - PCV13) 01/09/2013  . INFLUENZA VACCINE  10/27/2018  . Fecal DNA (Cologuard)  08/09/2020       Plan:      Ms. Tvedt , Thank you for taking time to come for your Medicare Wellness Visit. I appreciate your ongoing commitment to your health goals. Please review the following plan we discussed and let me know if I can assist you in the future.    Please schedule your next medicare wellness visit with me in 1 yr.    These are the goals we discussed: Goals    . Exercise 3x per week (30 min per time)     Would like to start using a stationary bike and building endurance on that.       This is a list of the screening recommended for you and due dates:  Health Maintenance  Topic Date Due  .  Hepatitis C: One time screening is recommended by Center for Disease Control   (CDC) for  adults born from [birth year] through 1963.   1945-05-22  . Tetanus Vaccine  01/10/1967  . Mammogram  01/09/1998  . DEXA scan (bone density measurement)  01/09/2013  . Pneumonia vaccines (1 of 2 - PCV13) 01/09/2013  . Flu Shot  10/27/2018  . Cologuard (Stool DNA test)  08/09/2020      These are the goals we discussed: Goals    . Exercise 3x per week (30 min per time)     Would like to start using a stationary bike and building endurance on that.       This is a list of the screening recommended for you and due dates:  Health Maintenance  Topic Date Due  .  Hepatitis C: One time screening is recommended by Center for Disease Control  (CDC) for  adults born from [birth year] through 1963.   1945-06-22  . Tetanus Vaccine  01/10/1967  . Mammogram  01/09/1998  . DEXA scan (bone density measurement)  01/09/2013  . Pneumonia vaccines (1 of 2 - PCV13) 01/09/2013  . Flu Shot  10/27/2018  . Cologuard (Stool DNA test)  08/09/2020     These are the goals we discussed: Goals    . Exercise 3x per week (30 min per time)     Would like to start using a stationary bike and building endurance on that.       This is a list of the screening recommended for you and due dates:  Health Maintenance  Topic Date Due  .  Hepatitis C: One time screening is recommended by Center for Disease Control  (CDC) for  adults born from [birth year] through 1963.   [birth year]  . Tetanus Vaccine  01/10/1967  . Mammogram  01/09/1998  . DEXA scan (  bone density measurement)  01/09/2013  . Pneumonia vaccines (1 of 2 - PCV13) 01/09/2013  . Flu Shot  10/27/2018  . Cologuard (Stool DNA test)  08/09/2020     I have personally reviewed and noted the following in the patient's chart:   . Medical and social history . Use of alcohol, tobacco or illicit drugs  . Current medications and supplements . Functional ability and status . Nutritional status . Physical activity . Advanced directives . List of other  physicians . Hospitalizations, surgeries, and ER visits in previous 12 months . Vitals . Screenings to include cognitive, depression, and falls . Referrals and appointments  In addition, I have reviewed and discussed with patient certain preventive protocols, quality metrics, and best practice recommendations. A written personalized care plan for preventive services as well as general preventive health recommendations were provided to patient.     Normand SloopKimberly A Katurah Karapetian, LPN   1/61/09604/20/2020

## 2018-07-16 ENCOUNTER — Ambulatory Visit (INDEPENDENT_AMBULATORY_CARE_PROVIDER_SITE_OTHER): Payer: Medicare Other | Admitting: *Deleted

## 2018-07-16 VITALS — BP 131/70 | HR 67 | Ht 64.0 in | Wt 219.0 lb

## 2018-07-16 DIAGNOSIS — Z129 Encounter for screening for malignant neoplasm, site unspecified: Secondary | ICD-10-CM

## 2018-07-16 DIAGNOSIS — Z0000 Encounter for general adult medical examination without abnormal findings: Secondary | ICD-10-CM | POA: Diagnosis not present

## 2018-07-16 DIAGNOSIS — Z780 Asymptomatic menopausal state: Secondary | ICD-10-CM

## 2018-07-16 DIAGNOSIS — Z13820 Encounter for screening for osteoporosis: Secondary | ICD-10-CM

## 2018-07-16 DIAGNOSIS — Z1239 Encounter for other screening for malignant neoplasm of breast: Secondary | ICD-10-CM | POA: Diagnosis not present

## 2018-07-16 NOTE — Patient Instructions (Addendum)
Ms. Candace Williams , Thank you for taking time to come for your Medicare Wellness Visit. I appreciate your ongoing commitment to your health goals. Please review the following plan we discussed and let me know if I can assist you in the future.    Please schedule your next medicare wellness visit with me in 1 yr.  These are the goals we discussed: Goals    . Exercise 3x per week (30 min per time)     Would like to start using a stationary bike and building endurance on that.

## 2018-08-10 ENCOUNTER — Other Ambulatory Visit: Payer: Self-pay | Admitting: Osteopathic Medicine

## 2018-08-10 DIAGNOSIS — F419 Anxiety disorder, unspecified: Secondary | ICD-10-CM

## 2018-08-15 ENCOUNTER — Telehealth: Payer: Self-pay | Admitting: Osteopathic Medicine

## 2018-08-15 ENCOUNTER — Other Ambulatory Visit: Payer: Self-pay | Admitting: Osteopathic Medicine

## 2018-08-15 DIAGNOSIS — F419 Anxiety disorder, unspecified: Secondary | ICD-10-CM

## 2018-08-15 MED ORDER — PAROXETINE HCL 20 MG PO TABS: ORAL_TABLET | ORAL | 0 refills | Status: DC

## 2018-08-15 MED ORDER — SIMVASTATIN 40 MG PO TABS: ORAL_TABLET | ORAL | 0 refills | Status: DC

## 2018-08-15 NOTE — Telephone Encounter (Signed)
Pt is calling for all refills to be sent in to [facility] on file. Pt completed her medicare wellness last month. Routing to PCP to see when she is supposed to follow with PCP next, and how many refills we have provide based on the medicare wellness visit.

## 2018-08-15 NOTE — Telephone Encounter (Signed)
Pt advised. She will schedule in June with PCP. Refill sent for 30 day supply.

## 2018-08-15 NOTE — Telephone Encounter (Signed)
I haven't seen her since 05/2017, last labs around then, too. Given pandemic, I'm ok to hold off on follow-up with me, 6 mos refills ok, but will need to check in with me sometime in the next 3-6 months for physical exam and labs   BP Readings from Last 3 Encounters:  07/16/18 131/70  06/21/17 (!) 153/87  04/22/16 (!) 158/89

## 2018-10-29 ENCOUNTER — Other Ambulatory Visit: Payer: Self-pay

## 2018-11-27 ENCOUNTER — Telehealth: Payer: Self-pay

## 2018-11-27 DIAGNOSIS — Z13820 Encounter for screening for osteoporosis: Secondary | ICD-10-CM

## 2018-11-27 DIAGNOSIS — E7800 Pure hypercholesterolemia, unspecified: Secondary | ICD-10-CM

## 2018-11-27 DIAGNOSIS — Z0000 Encounter for general adult medical examination without abnormal findings: Secondary | ICD-10-CM

## 2018-11-27 DIAGNOSIS — Z780 Asymptomatic menopausal state: Secondary | ICD-10-CM

## 2018-11-27 DIAGNOSIS — R030 Elevated blood-pressure reading, without diagnosis of hypertension: Secondary | ICD-10-CM

## 2018-11-27 DIAGNOSIS — F419 Anxiety disorder, unspecified: Secondary | ICD-10-CM

## 2018-11-27 DIAGNOSIS — M1990 Unspecified osteoarthritis, unspecified site: Secondary | ICD-10-CM

## 2018-11-27 NOTE — Telephone Encounter (Signed)
Orders are in for labs, fasting at least 2 days prior to visit.   Can we also make sure her physical in a 40-minute for Medicare Wellness w/ Dr A (can't get done w/ Maudie Mercury)

## 2018-11-27 NOTE — Telephone Encounter (Signed)
Patient does not want a CPE at this time and declined. She has a med follow up next week. She is aware that labs are in to get them completed.

## 2018-11-27 NOTE — Telephone Encounter (Signed)
Pt called requesting annual labs. As per pt, she will be due for yearly appt and wants labs ahead of time. Pls advise, thanks.

## 2018-11-29 DIAGNOSIS — Z780 Asymptomatic menopausal state: Secondary | ICD-10-CM | POA: Diagnosis not present

## 2018-11-29 DIAGNOSIS — Z13820 Encounter for screening for osteoporosis: Secondary | ICD-10-CM | POA: Diagnosis not present

## 2018-11-29 DIAGNOSIS — R030 Elevated blood-pressure reading, without diagnosis of hypertension: Secondary | ICD-10-CM | POA: Diagnosis not present

## 2018-11-29 DIAGNOSIS — F419 Anxiety disorder, unspecified: Secondary | ICD-10-CM | POA: Diagnosis not present

## 2018-11-29 DIAGNOSIS — M1990 Unspecified osteoarthritis, unspecified site: Secondary | ICD-10-CM | POA: Diagnosis not present

## 2018-11-29 DIAGNOSIS — E7800 Pure hypercholesterolemia, unspecified: Secondary | ICD-10-CM | POA: Diagnosis not present

## 2018-11-29 DIAGNOSIS — Z0000 Encounter for general adult medical examination without abnormal findings: Secondary | ICD-10-CM | POA: Diagnosis not present

## 2018-11-29 LAB — CBC
HCT: 43.2 % (ref 35.0–45.0)
Hemoglobin: 14.5 g/dL (ref 11.7–15.5)
MCH: 30.4 pg (ref 27.0–33.0)
MCHC: 33.6 g/dL (ref 32.0–36.0)
MCV: 90.6 fL (ref 80.0–100.0)
MPV: 11 fL (ref 7.5–12.5)
Platelets: 202 10*3/uL (ref 140–400)
RBC: 4.77 10*6/uL (ref 3.80–5.10)
RDW: 13.8 % (ref 11.0–15.0)
WBC: 7.4 10*3/uL (ref 3.8–10.8)

## 2018-11-29 LAB — COMPLETE METABOLIC PANEL WITH GFR
AG Ratio: 1.6 (calc) (ref 1.0–2.5)
ALT: 12 U/L (ref 6–29)
AST: 16 U/L (ref 10–35)
Albumin: 4.4 g/dL (ref 3.6–5.1)
Alkaline phosphatase (APISO): 54 U/L (ref 37–153)
BUN: 12 mg/dL (ref 7–25)
CO2: 29 mmol/L (ref 20–32)
Calcium: 9.5 mg/dL (ref 8.6–10.4)
Chloride: 101 mmol/L (ref 98–110)
Creat: 0.89 mg/dL (ref 0.60–0.93)
GFR, Est African American: 76 mL/min/{1.73_m2} (ref >=60)
GFR, Est Non African American: 66 mL/min/{1.73_m2} (ref >=60)
Globulin: 2.7 g/dL (calc) (ref 1.9–3.7)
Glucose, Bld: 98 mg/dL (ref 65–99)
Potassium: 4.3 mmol/L (ref 3.5–5.3)
Sodium: 139 mmol/L (ref 135–146)
Total Bilirubin: 0.6 mg/dL (ref 0.2–1.2)
Total Protein: 7.1 g/dL (ref 6.1–8.1)

## 2018-11-29 LAB — LIPID PANEL
Cholesterol: 223 mg/dL — ABNORMAL HIGH (ref <200)
HDL: 47 mg/dL — ABNORMAL LOW (ref >=50)
LDL Cholesterol (Calc): 147 mg/dL (calc) — ABNORMAL HIGH
Non-HDL Cholesterol (Calc): 176 mg/dL (calc) — ABNORMAL HIGH (ref <130)
Total CHOL/HDL Ratio: 4.7 (calc) (ref <5.0)
Triglycerides: 159 mg/dL — ABNORMAL HIGH (ref <150)

## 2018-12-05 ENCOUNTER — Encounter: Payer: Self-pay | Admitting: Osteopathic Medicine

## 2018-12-05 ENCOUNTER — Ambulatory Visit (INDEPENDENT_AMBULATORY_CARE_PROVIDER_SITE_OTHER): Payer: Medicare Other | Admitting: Osteopathic Medicine

## 2018-12-05 VITALS — BP 129/69 | HR 63 | Temp 98.1°F | Wt 222.0 lb

## 2018-12-05 DIAGNOSIS — G8929 Other chronic pain: Secondary | ICD-10-CM

## 2018-12-05 DIAGNOSIS — M25562 Pain in left knee: Secondary | ICD-10-CM | POA: Diagnosis not present

## 2018-12-05 DIAGNOSIS — E7800 Pure hypercholesterolemia, unspecified: Secondary | ICD-10-CM | POA: Diagnosis not present

## 2018-12-05 DIAGNOSIS — F419 Anxiety disorder, unspecified: Secondary | ICD-10-CM

## 2018-12-05 MED ORDER — MELOXICAM 15 MG PO TABS: 7.5000 mg | ORAL_TABLET | Freq: Every day | ORAL | 3 refills | Status: DC

## 2018-12-05 MED ORDER — ALPRAZOLAM 0.25 MG PO TABS: 0.2500 mg | ORAL_TABLET | Freq: Two times a day (BID) | ORAL | 0 refills | Status: DC | PRN

## 2018-12-05 MED ORDER — PAROXETINE HCL 30 MG PO TABS: 30.0000 mg | ORAL_TABLET | Freq: Every day | ORAL | 3 refills | Status: DC

## 2018-12-05 MED ORDER — SIMVASTATIN 40 MG PO TABS: 20.0000 mg | ORAL_TABLET | Freq: Every day | ORAL | 1 refills | Status: DC

## 2018-12-05 NOTE — Progress Notes (Signed)
Virtual Visit via Video (App used: Doximity) Note  I connected with      Candace BouchardElizabeth Williams on 12/05/18 at 4:03 PM by a telemedicine application and verified that I am speaking with the correct person using two identifiers.  Patient is at home I am in office   I discussed the limitations of evaluation and management by telemedicine and the availability of in person appointments. The patient expressed understanding and agreed to proceed.  History of Present Illness: Candace Bouchardlizabeth Williams is a 73 y.o. female who would like to discuss medications refilled     Recent virtual visit d/t COVID pandemic  Pt requesting refills.   Cholesterol recently checked, she had not been taking the statin medication and this definitely made a difference in elevation of LDL.  Patient reports that the Paxil has overall been working pretty well for her but she has noticed an uptake and anxiety.  Inquires about increasing this medication.  Reports some issues with Achilles tendinitis and bracing of the knee which she thinks has caused some sort of lump to develop on that knee on the right side.  [facility] 2/9 07/16/2018 04/22/2016  Decreased Interest 0 2  Down, Depressed, Hopeless 0 1  PHQ - 2 Score 0 3  Altered sleeping - 2  Tired, decreased energy - 2  Change in appetite - 0  Feeling bad or failure about yourself  - 3  Trouble concentrating - 0  Moving slowly or fidgety/restless - 0  Suicidal thoughts - 0  PHQ-9 Score - 10    GAD 7 : Generalized Anxiety Score 04/22/2016  Nervous, Anxious, on Edge 1  Control/stop worrying 1  Worry too much - different things 0  Trouble relaxing 0  Restless 0  Easily annoyed or irritable 2  Afraid - awful might happen 2  Total GAD 7 Score 6         Observations/Objective: BP 129/69 (BP Location: Left Arm, Patient Position: Sitting, Cuff Size: Normal)   Pulse 63   Temp 98.1 F (36.7 C) (Oral)   Wt 222 lb (100.7 kg)   BMI 38.11 kg/m  BP  Readings from Last 3 Encounters:  12/05/18 129/69  07/16/18 131/70  06/21/17 (!) 153/87   Exam: Normal Speech.  NAD  Lab and Radiology Results No results found for this or any previous visit (from the past 72 hour(s)). No results found.     Assessment and Plan: 73 y.o. female with The primary encounter diagnosis was High cholesterol. Diagnoses of Anxiety and Chronic pain of left knee were also pertinent to this visit.  Increase Paxil, patient to let me know if this does not seem to be helping and we may consider increasing all the way up to 40 mg or switching medications.  PDMP reviewed during this encounter. No orders of the defined types were placed in this encounter.  Meds ordered this encounter  Medications  . simvastatin (ZOCOR) 40 MG tablet    Sig: Take 0.5 tablets (20 mg total) by mouth daily at 6 PM.    Dispense:  90 tablet    Refill:  1  . PARoxetine (PAXIL) 30 MG tablet    Sig: Take 1 tablet (30 mg total) by mouth daily.    Dispense:  90 tablet    Refill:  3  . meloxicam (MOBIC) 15 MG tablet    Sig: Take 0.5-1 tablets (7.5-15 mg total) by mouth daily. As needed for aches/pain    Dispense:  30  tablet    Refill:  3  . ALPRAZolam (XANAX) 0.25 MG tablet    Sig: Take 1 tablet (0.25 mg total) by mouth 2 (two) times daily as needed. for anxiety    Dispense:  15 tablet    Refill:  0      Follow Up Instructions: Return in about 1 year (around 12/05/2019) for Annual / Medicare visit w/ Dr Sheppard Coil. .    I discussed the assessment and treatment plan with the patient. The patient was provided an opportunity to ask questions and all were answered. The patient agreed with the plan and demonstrated an understanding of the instructions.   The patient was advised to call back or seek an in-person evaluation if any new concerns, if symptoms worsen or if the condition fails to improve as anticipated.  25 minutes of non-face-to-face time was provided during this  encounter.                      Historical information moved to improve visibility of documentation.  Past Medical History:  Diagnosis Date  . Anxiety 09/23/2013  . Depression   . Hyperlipidemia 09/23/2013  . Tendonitis    right foot   No past surgical history on file. Social History   Tobacco Use  . Smoking status: Never Smoker  . Smokeless tobacco: Never Used  Substance Use Topics  . Alcohol use: Yes    Alcohol/week: 0.0 standard drinks   family history includes Depression in an other family member; Hypertension in her mother; Throat cancer in her father; Thyroid disease in her mother.  Medications: Current Outpatient Medications  Medication Sig Dispense Refill  . ALPRAZolam (XANAX) 0.25 MG tablet Take 1 tablet by mouth twice daily as needed for anxiety 5 tablet 0  . meloxicam (MOBIC) 15 MG tablet Take 0.5-1 tablets (7.5-15 mg total) by mouth daily. As needed for aches/pain 30 tablet 3  . PARoxetine (PAXIL) 20 MG tablet TAKE 1 TABLET BY MOUTH ONCE DAILY. NEEDS APPOINTMENT 30 tablet 0  . simvastatin (ZOCOR) 40 MG tablet TAKE 1 TABLET BY MOUTH ONCE DAILY AT  6  PM.  NEEDS  APPOINTMENT 30 tablet 0   No current facility-administered medications for this visit.    No Known Allergies  PDMP not reviewed this encounter. No orders of the defined types were placed in this encounter.  No orders of the defined types were placed in this encounter.

## 2019-09-18 DIAGNOSIS — M25561 Pain in right knee: Secondary | ICD-10-CM | POA: Diagnosis not present

## 2019-09-18 DIAGNOSIS — M170 Bilateral primary osteoarthritis of knee: Secondary | ICD-10-CM | POA: Diagnosis not present

## 2019-11-28 ENCOUNTER — Other Ambulatory Visit: Payer: Self-pay

## 2019-11-28 ENCOUNTER — Ambulatory Visit (INDEPENDENT_AMBULATORY_CARE_PROVIDER_SITE_OTHER): Payer: Medicare HMO | Admitting: Osteopathic Medicine

## 2019-11-28 ENCOUNTER — Encounter: Payer: Self-pay | Admitting: Osteopathic Medicine

## 2019-11-28 VITALS — BP 154/81 | HR 76 | Wt 226.0 lb

## 2019-11-28 DIAGNOSIS — R69 Illness, unspecified: Secondary | ICD-10-CM | POA: Diagnosis not present

## 2019-11-28 DIAGNOSIS — Z13820 Encounter for screening for osteoporosis: Secondary | ICD-10-CM

## 2019-11-28 DIAGNOSIS — Z23 Encounter for immunization: Secondary | ICD-10-CM

## 2019-11-28 DIAGNOSIS — Z780 Asymptomatic menopausal state: Secondary | ICD-10-CM | POA: Diagnosis not present

## 2019-11-28 DIAGNOSIS — Z1231 Encounter for screening mammogram for malignant neoplasm of breast: Secondary | ICD-10-CM

## 2019-11-28 DIAGNOSIS — E785 Hyperlipidemia, unspecified: Secondary | ICD-10-CM | POA: Diagnosis not present

## 2019-11-28 DIAGNOSIS — F411 Generalized anxiety disorder: Secondary | ICD-10-CM | POA: Diagnosis not present

## 2019-11-28 MED ORDER — PAROXETINE HCL 20 MG PO TABS: ORAL_TABLET | ORAL | 0 refills | Status: DC

## 2019-11-28 MED ORDER — SIMVASTATIN 20 MG PO TABS: 20.0000 mg | ORAL_TABLET | Freq: Every day | ORAL | 3 refills | Status: DC

## 2019-11-28 MED ORDER — ESCITALOPRAM OXALATE 10 MG PO TABS: ORAL_TABLET | ORAL | 0 refills | Status: DC

## 2019-11-28 NOTE — Progress Notes (Signed)
Candace Williams is a 74 y.o. female who presents to  [facility] at [facility]  today, 11/28/19, seeking care for the following:  Marland Kitchen Mental health - increased anxiety, wants to discuss increasing Paxil. Last visit for this about a year ago was last I saw her. At that time also reported increased anxiety, we increased Paxil at that visit to 30 mg, discussed going up on Paxil to max 40 mg.      ASSESSMENT & PLAN with other pertinent findings:  The primary encounter diagnosis was Generalized anxiety disorder. Diagnoses of Need for vaccine for Td (tetanus-diphtheria), Encounter for screening mammogram for malignant neoplasm of breast, Screening for osteoporosis, Postmenopausal, and Flu vaccine need were also pertinent to this visit.     Patient Instructions  Week 1: Paxil 20 mg + Lexapro 5 mg   Week 2: Paxil 10 mg + Lexapro 10 mg   Week 3 - 4 Lexapro 20 mg alone     Labs today Ordered mammogram, bone density test  Tetanus booster and Shingles vaccines at pharmacy Flu shot today in office - done!        Orders Placed This Encounter  Procedures  . MM 3D SCREEN BREAST BILATERAL  . DG Bone Density  . Flu Vaccine QUAD High Dose(Fluad)  . CBC  . COMPLETE METABOLIC PANEL WITH GFR  . Lipid panel    Meds ordered this encounter  Medications  . escitalopram (LEXAPRO) 10 MG tablet    Sig: Take 0.5 tablets (5 mg total) by mouth daily for 7 days, THEN 1 tablet (10 mg total) daily for 7 days, THEN 2 tablets (20 mg total) daily for 14 days.    Dispense:  40 tablet    Refill:  0  . PARoxetine (PAXIL) 20 MG tablet    Sig: Take 1 tablet (20 mg total) by mouth daily for 7 days, THEN 0.5 tablets (10 mg total) daily for 7 days.    Dispense:  11 tablet    Refill:  0  . simvastatin (ZOCOR) 20 MG tablet    Sig: Take 1 tablet (20 mg total) by mouth daily.    Dispense:  90 tablet    Refill:  3       Follow-up instructions: Return in  about 4 weeks (around 12/26/2019) for VIRTUAL OR IN-OFFICE VISIT FOR RECHECK MENTAL HEALTH .                                         BP (!) 154/81 (BP Location: Right Arm, Patient Position: Sitting)   Pulse 76   Wt 226 lb (102.5 kg)   SpO2 96%   BMI 38.79 kg/m   Current Meds  Medication Sig  . ALPRAZolam (XANAX) 0.25 MG tablet Take 1 tablet (0.25 mg total) by mouth 2 (two) times daily as needed. for anxiety  . meloxicam (MOBIC) 15 MG tablet Take 0.5-1 tablets (7.5-15 mg total) by mouth daily. As needed for aches/pain  . PARoxetine (PAXIL) 20 MG tablet Take 1 tablet (20 mg total) by mouth daily for 7 days, THEN 0.5 tablets (10 mg total) daily for 7 days.  . simvastatin (ZOCOR) 20 MG tablet Take 1 tablet (20 mg total) by mouth daily.  . [DISCONTINUED] PARoxetine (PAXIL) 30 MG tablet Take 1 tablet (30 mg total) by mouth daily.  . [DISCONTINUED] simvastatin (ZOCOR) 40 MG tablet  Take 0.5 tablets (20 mg total) by mouth daily at 6 PM.    No results found for this or any previous visit (from the past 72 hour(s)).  No results found.     All questions at time of visit were answered - patient instructed to contact office with any additional concerns or updates.  ER/RTC precautions were reviewed with the patient as applicable.   Please note: voice recognition software was used to produce this document, and typos may escape review. Please contact Dr. Lyn Hollingshead for any needed clarifications.

## 2019-11-28 NOTE — Patient Instructions (Addendum)
Week 1: Paxil 20 mg + Lexapro 5 mg   Week 2: Paxil 10 mg + Lexapro 10 mg   Week 3 - 4 Lexapro 20 mg alone     Labs today Ordered mammogram, bone density test  Tetanus booster and Shingles vaccines at pharmacy Flu shot today in office - done!

## 2019-11-29 LAB — COMPLETE METABOLIC PANEL WITH GFR
AG Ratio: 1.5 (calc) (ref 1.0–2.5)
ALT: 10 U/L (ref 6–29)
AST: 14 U/L (ref 10–35)
Albumin: 4.4 g/dL (ref 3.6–5.1)
Alkaline phosphatase (APISO): 57 U/L (ref 37–153)
BUN: 16 mg/dL (ref 7–25)
CO2: 27 mmol/L (ref 20–32)
Calcium: 9.6 mg/dL (ref 8.6–10.4)
Chloride: 101 mmol/L (ref 98–110)
Creat: 0.88 mg/dL (ref 0.60–0.93)
GFR, Est African American: 77 mL/min/{1.73_m2} (ref >=60)
GFR, Est Non African American: 66 mL/min/{1.73_m2} (ref >=60)
Globulin: 3 g/dL (calc) (ref 1.9–3.7)
Glucose, Bld: 97 mg/dL (ref 65–99)
Potassium: 4.3 mmol/L (ref 3.5–5.3)
Sodium: 138 mmol/L (ref 135–146)
Total Bilirubin: 0.7 mg/dL (ref 0.2–1.2)
Total Protein: 7.4 g/dL (ref 6.1–8.1)

## 2019-11-29 LAB — LIPID PANEL
Cholesterol: 182 mg/dL (ref <200)
HDL: 51 mg/dL (ref >=50)
LDL Cholesterol (Calc): 105 mg/dL (calc) — ABNORMAL HIGH
Non-HDL Cholesterol (Calc): 131 mg/dL (calc) — ABNORMAL HIGH (ref <130)
Total CHOL/HDL Ratio: 3.6 (calc) (ref <5.0)
Triglycerides: 145 mg/dL (ref <150)

## 2019-11-29 LAB — CBC
HCT: 41.2 % (ref 35.0–45.0)
Hemoglobin: 14.1 g/dL (ref 11.7–15.5)
MCH: 30.1 pg (ref 27.0–33.0)
MCHC: 34.2 g/dL (ref 32.0–36.0)
MCV: 88 fL (ref 80.0–100.0)
MPV: 10.7 fL (ref 7.5–12.5)
Platelets: 208 10*3/uL (ref 140–400)
RBC: 4.68 10*6/uL (ref 3.80–5.10)
RDW: 13.6 % (ref 11.0–15.0)
WBC: 7.6 10*3/uL (ref 3.8–10.8)

## 2019-12-02 ENCOUNTER — Encounter: Payer: Self-pay | Admitting: Osteopathic Medicine

## 2019-12-18 ENCOUNTER — Ambulatory Visit (INDEPENDENT_AMBULATORY_CARE_PROVIDER_SITE_OTHER): Payer: Medicare HMO

## 2019-12-18 ENCOUNTER — Other Ambulatory Visit: Payer: Self-pay

## 2019-12-18 DIAGNOSIS — Z13820 Encounter for screening for osteoporosis: Secondary | ICD-10-CM | POA: Diagnosis not present

## 2019-12-18 DIAGNOSIS — Z780 Asymptomatic menopausal state: Secondary | ICD-10-CM | POA: Diagnosis not present

## 2019-12-18 DIAGNOSIS — Z1231 Encounter for screening mammogram for malignant neoplasm of breast: Secondary | ICD-10-CM | POA: Diagnosis not present

## 2019-12-18 DIAGNOSIS — E2839 Other primary ovarian failure: Secondary | ICD-10-CM | POA: Diagnosis not present

## 2019-12-30 ENCOUNTER — Other Ambulatory Visit: Payer: Self-pay | Admitting: Osteopathic Medicine

## 2019-12-31 ENCOUNTER — Encounter: Payer: Self-pay | Admitting: Osteopathic Medicine

## 2019-12-31 ENCOUNTER — Telehealth (INDEPENDENT_AMBULATORY_CARE_PROVIDER_SITE_OTHER): Payer: Medicare HMO | Admitting: Osteopathic Medicine

## 2019-12-31 VITALS — BP 135/77 | HR 65 | Wt 225.0 lb

## 2019-12-31 DIAGNOSIS — F411 Generalized anxiety disorder: Secondary | ICD-10-CM

## 2019-12-31 DIAGNOSIS — R69 Illness, unspecified: Secondary | ICD-10-CM | POA: Diagnosis not present

## 2019-12-31 MED ORDER — ESCITALOPRAM OXALATE 20 MG PO TABS: 20.0000 mg | ORAL_TABLET | Freq: Every day | ORAL | 1 refills | Status: DC

## 2019-12-31 MED ORDER — ALPRAZOLAM 0.25 MG PO TABS
0.2500 mg | ORAL_TABLET | Freq: Two times a day (BID) | ORAL | 0 refills | Status: DC | PRN
Start: 2019-12-31 — End: 2021-04-15

## 2019-12-31 NOTE — Progress Notes (Signed)
Virtual Visit via Video  I connected with      Candace Williams on 12/31/19 at 1:42 PM  by a telemedicine application and verified that I am speaking with the correct person using two identifiers.  Patient is in separate location from physician I am in office   I discussed the limitations of evaluation and management by telemedicine and the availability of in person appointments. The patient expressed understanding and agreed to proceed.  History of Present Illness: Candace Williams is a 74 y.o. female who would like to discuss mental health follow-up.   Last visit 11/28/2019: c/o increased anxiety. Had been on Paxil for years. Feeling a bit better, has only been on the Lexapro 10 mg, was instructed to take 2 pills but missed this information.     Depression screen [facility] 2/9 12/31/2019 11/28/2019 07/16/2018  Decreased Interest 0 3 0  Down, Depressed, Hopeless 1 1 0  PHQ - 2 Score 1 4 0  Altered sleeping 1 2 -  Tired, decreased energy 1 3 -  Change in appetite 0 0 -  Feeling bad or failure about yourself  2 1 -  Trouble concentrating 0 0 -  Moving slowly or fidgety/restless 0 0 -  Suicidal thoughts 0 0 -  PHQ-9 Score 5 10 -  Difficult doing work/chores Somewhat difficult Somewhat difficult -   GAD 7 : Generalized Anxiety Score 12/31/2019 11/28/2019 04/22/2016  Nervous, Anxious, on Edge 1 3 1   Control/stop worrying 2 2 1   Worry too much - different things 1 3 0  Trouble relaxing 1 2 0  Restless 0 0 0  Easily annoyed or irritable 1 - 2  Afraid - awful might happen 1 3 2   Total GAD 7 Score 7 - 6  Anxiety Difficulty - Somewhat difficult -        Observations/Objective: There were no vitals taken for this visit. BP Readings from Last 3 Encounters:  11/28/19 (!) 154/81  12/05/18 129/69  07/16/18 131/70   Exam: Normal Speech.  NAD  Lab and Radiology Results No results found for this or any previous visit (from the past 72 hour(s)). No results  found.     Assessment and Plan: 74 y.o. female with The encounter diagnosis was Generalized anxiety disorder.  Will increase Lexapro to 20 mg and recheck in 4 weeks!   PDMP not reviewed this encounter. No orders of the defined types were placed in this encounter.  Meds ordered this encounter  Medications  . escitalopram (LEXAPRO) 20 MG tablet    Sig: Take 1 tablet (20 mg total) by mouth daily.    Dispense:  90 tablet    Refill:  1  . ALPRAZolam (XANAX) 0.25 MG tablet    Sig: Take 1 tablet (0.25 mg total) by mouth 2 (two) times daily as needed. for anxiety    Dispense:  15 tablet    Refill:  0     Follow Up Instructions: Return in about 4 weeks (around 01/28/2020) for VIRTUAL VISIT RECHECK MENTAL HEALTH .    I discussed the assessment and treatment plan with the patient. The patient was provided an opportunity to ask questions and all were answered. The patient agreed with the plan and demonstrated an understanding of the instructions.   The patient was advised to call back or seek an in-person evaluation if any new concerns, if symptoms worsen or if the condition fails to improve as anticipated.  30 minutes of non-face-to-face time was provided during  this encounter.      . . . . . . . . . . . . . Marland Kitchen                   Historical information moved to improve visibility of documentation.  Past Medical History:  Diagnosis Date  . Anxiety 09/23/2013  . Depression   . Hyperlipidemia 09/23/2013  . Tendonitis    right foot   No past surgical history on file. Social History   Tobacco Use  . Smoking status: Never Smoker  . Smokeless tobacco: Never Used  Substance Use Topics  . Alcohol use: Yes    Alcohol/week: 0.0 standard drinks   family history includes Depression in an other family member; Hypertension in her mother; Throat cancer in her father; Thyroid disease in her mother.  Medications: Current Outpatient Medications  Medication  Sig Dispense Refill  . ALPRAZolam (XANAX) 0.25 MG tablet Take 1 tablet (0.25 mg total) by mouth 2 (two) times daily as needed. for anxiety 15 tablet 0  . escitalopram (LEXAPRO) 10 MG tablet Take 0.5 tablets (5 mg total) by mouth daily for 7 days, THEN 1 tablet (10 mg total) daily for 7 days, THEN 2 tablets (20 mg total) daily for 14 days. 40 tablet 0  . meloxicam (MOBIC) 15 MG tablet Take 0.5-1 tablets (7.5-15 mg total) by mouth daily. As needed for aches/pain 30 tablet 3  . PARoxetine (PAXIL) 20 MG tablet Take 1 tablet (20 mg total) by mouth daily for 7 days, THEN 0.5 tablets (10 mg total) daily for 7 days. 11 tablet 0  . simvastatin (ZOCOR) 20 MG tablet Take 1 tablet (20 mg total) by mouth daily. 90 tablet 3   No current facility-administered medications for this visit.   No Known Allergies

## 2020-01-02 ENCOUNTER — Encounter: Payer: Self-pay | Admitting: Osteopathic Medicine

## 2020-01-27 ENCOUNTER — Encounter: Payer: Self-pay | Admitting: Osteopathic Medicine

## 2020-01-28 ENCOUNTER — Other Ambulatory Visit: Payer: Self-pay | Admitting: Osteopathic Medicine

## 2020-01-28 ENCOUNTER — Telehealth: Payer: Medicare HMO | Admitting: Osteopathic Medicine

## 2020-01-30 DIAGNOSIS — R69 Illness, unspecified: Secondary | ICD-10-CM | POA: Diagnosis not present

## 2020-02-19 DIAGNOSIS — R69 Illness, unspecified: Secondary | ICD-10-CM | POA: Diagnosis not present

## 2020-03-05 DIAGNOSIS — R69 Illness, unspecified: Secondary | ICD-10-CM | POA: Diagnosis not present

## 2020-03-17 DIAGNOSIS — H04123 Dry eye syndrome of bilateral lacrimal glands: Secondary | ICD-10-CM | POA: Diagnosis not present

## 2020-03-17 DIAGNOSIS — H5213 Myopia, bilateral: Secondary | ICD-10-CM | POA: Diagnosis not present

## 2020-04-08 ENCOUNTER — Telehealth: Payer: Self-pay | Admitting: General Practice

## 2020-05-15 NOTE — Telephone Encounter (Signed)
Documentation only.

## 2020-07-01 ENCOUNTER — Encounter: Payer: Self-pay | Admitting: Osteopathic Medicine

## 2020-07-01 ENCOUNTER — Other Ambulatory Visit: Payer: Self-pay

## 2020-07-01 MED ORDER — ESCITALOPRAM OXALATE 20 MG PO TABS
20.0000 mg | ORAL_TABLET | Freq: Every day | ORAL | 1 refills | Status: DC
Start: 2020-07-01 — End: 2020-12-14

## 2020-08-19 ENCOUNTER — Other Ambulatory Visit: Payer: Self-pay | Admitting: Osteopathic Medicine

## 2020-08-25 DIAGNOSIS — Z20822 Contact with and (suspected) exposure to covid-19: Secondary | ICD-10-CM | POA: Diagnosis not present

## 2020-10-07 ENCOUNTER — Telehealth: Payer: Self-pay | Admitting: Osteopathic Medicine

## 2020-10-07 NOTE — Chronic Care Management (AMB) (Signed)
  Chronic Care Management   Outreach Note  10/07/2020 Name: Candace Williams MRN: [id number] DOB: 11-Dec-1945  Referred by: Sunnie Nielsen, DO Reason for referral : No chief complaint on file.   An unsuccessful telephone outreach was attempted today. The patient was referred to the pharmacist for assistance with care management and care coordination.   Follow Up Plan:   Carmell [place] [facility] Scheduler

## 2020-10-15 DIAGNOSIS — Z23 Encounter for immunization: Secondary | ICD-10-CM | POA: Diagnosis not present

## 2020-10-19 ENCOUNTER — Telehealth: Payer: Self-pay | Admitting: Osteopathic Medicine

## 2020-10-19 NOTE — Chronic Care Management (AMB) (Signed)
  Chronic Care Management   Outreach Note  10/19/2020 Name: Candace Williams MRN: [id number] DOB: 1945-05-31  Referred by: Sunnie Nielsen, DO Reason for referral : No chief complaint on file.   A second unsuccessful telephone outreach was attempted today. The patient was referred to pharmacist for assistance with care management and care coordination.  Follow Up Plan:   Carmell Austria Up[employer] Scheduler

## 2020-12-05 ENCOUNTER — Other Ambulatory Visit: Payer: Self-pay | Admitting: Osteopathic Medicine

## 2020-12-14 ENCOUNTER — Other Ambulatory Visit: Payer: Self-pay

## 2020-12-14 MED ORDER — ESCITALOPRAM OXALATE 20 MG PO TABS
20.0000 mg | ORAL_TABLET | Freq: Every day | ORAL | 3 refills | Status: DC
Start: 2020-12-14 — End: 2021-03-09

## 2021-01-18 ENCOUNTER — Other Ambulatory Visit: Payer: Self-pay | Admitting: Osteopathic Medicine

## 2021-02-14 ENCOUNTER — Other Ambulatory Visit: Payer: Self-pay | Admitting: Medical-Surgical

## 2021-03-09 ENCOUNTER — Other Ambulatory Visit: Payer: Self-pay | Admitting: Osteopathic Medicine

## 2021-03-09 ENCOUNTER — Other Ambulatory Visit: Payer: Self-pay | Admitting: Medical-Surgical

## 2021-04-08 ENCOUNTER — Other Ambulatory Visit: Payer: Self-pay | Admitting: Family Medicine

## 2021-04-14 ENCOUNTER — Telehealth: Payer: Self-pay

## 2021-04-14 NOTE — Telephone Encounter (Signed)
She is welcome to do an acute visit to get her medications filled without changing to a new PCP. Looks like she has an appointment with Dr. Linford Arnold tomorrow.

## 2021-04-14 NOTE — Telephone Encounter (Signed)
Pt called wanting to know why her last Rx refills were only for a #15 day supply. She said that she was an [facility] pt but will be transferring to a new PCP at [facility], however the appt with them is not until March. She is wanting to know how to go about getting the escitalopram (LEXAPRO) 20 MG tablet and simvastatin (ZOCOR) 20 MG tablet filled until she can get in with [facility]. I told her that the letter sent out announcing Dr. Mardelle Matte departure stated that she could establish with a new PCP in our office and explained the refill protocol. I pointed out that her last #30 day refill had the notation that she needed to establish with a new PCP and have labs done and it was also stated on the last #15 day refill she got. Pt states she did not see that wording on her prescription bottle. She asked me what her options were and I told her that she could schedule an appt to establish with Dr. Ashley Royalty or with Ander Slade or she could see if she could get in with [facility] earlier. She said that she would check her calendar and call back to schedule an appt to establish with a new PCP in order to get her refills, but is still planning on transferring to [place] in March.

## 2021-04-15 ENCOUNTER — Encounter: Payer: Self-pay | Admitting: Family Medicine

## 2021-04-15 ENCOUNTER — Ambulatory Visit (INDEPENDENT_AMBULATORY_CARE_PROVIDER_SITE_OTHER): Payer: Medicare HMO | Admitting: Family Medicine

## 2021-04-15 ENCOUNTER — Other Ambulatory Visit: Payer: Self-pay

## 2021-04-15 VITALS — BP 143/73 | HR 72 | Ht 64.0 in | Wt 221.0 lb

## 2021-04-15 DIAGNOSIS — E785 Hyperlipidemia, unspecified: Secondary | ICD-10-CM | POA: Diagnosis not present

## 2021-04-15 DIAGNOSIS — Z79899 Other long term (current) drug therapy: Secondary | ICD-10-CM

## 2021-04-15 DIAGNOSIS — Z1329 Encounter for screening for other suspected endocrine disorder: Secondary | ICD-10-CM

## 2021-04-15 DIAGNOSIS — R030 Elevated blood-pressure reading, without diagnosis of hypertension: Secondary | ICD-10-CM

## 2021-04-15 DIAGNOSIS — F419 Anxiety disorder, unspecified: Secondary | ICD-10-CM

## 2021-04-15 MED ORDER — ESCITALOPRAM OXALATE 20 MG PO TABS: 20.0000 mg | ORAL_TABLET | Freq: Every day | ORAL | 0 refills | Status: AC

## 2021-04-15 MED ORDER — BUPROPION HCL ER (XL) 150 MG PO TB24: 150.0000 mg | ORAL_TABLET | Freq: Every day | ORAL | 0 refills | Status: AC

## 2021-04-15 MED ORDER — SIMVASTATIN 20 MG PO TABS: ORAL_TABLET | ORAL | 3 refills | Status: DC

## 2021-04-15 MED ORDER — ALPRAZOLAM 0.25 MG PO TABS: 0.2500 mg | ORAL_TABLET | Freq: Every day | ORAL | 0 refills | Status: AC | PRN

## 2021-04-15 NOTE — Assessment & Plan Note (Signed)
Blood pressure is still a little borderline elevated today.  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  Encouraged her to keep an eye on this at home and can address again with her future PCP in the meantime we will get some up-to-date blood work.

## 2021-04-15 NOTE — Progress Notes (Signed)
Established Patient Office Visit  Subjective:  Patient ID: Candace Williams, female    DOB: [birth year]  Age: 76 y.o. MRN: [id number]  CC:  Chief Complaint  Patient presents with   Follow-up    HPI Candace Williams presents to bridge her medications.  She is actually planning on transferring care to [facility] and has an appointment in March.  But she needs her medications refilled.  F/U Anxiety -overall she is doing fair.  She is had a lot of increased stressors in her life.  Her husband was recently diagnosed with A. fib and has been really dealing with some new onset medical problems.  She says she is also been struggling a lot more since COVID she is just not really wanting to get out of the house and engage and do things.  She is already had a pretty significant anxiety around driving that is not new.  She does have a prescription for alprazolam but just uses it sparingly.  She is currently on Lexapro 20 mg daily and has been on that for quite some time.  She has been also having some pain with her knees.  She knows she has some advanced arthritis.  Occasionally they will lock.  Hyperlipidemia - tolerating stating well with no myalgias or significant side effects.  Lab Results  Component Value Date   CHOL 182 11/28/2019   HDL 51 11/28/2019   LDLCALC 105 (H) 11/28/2019   TRIG 145 11/28/2019   CHOLHDL 3.6 11/28/2019       Past Medical History:  Diagnosis Date   Anxiety 09/23/2013   Depression    Hyperlipidemia 09/23/2013   Tendonitis    right foot    No past surgical history on file.  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  Tobacco Use   Smoking status: Never   Smokeless tobacco: Never  Vaping Use   Vaping Use: Never used  Substance and Sexual Activity   Alcohol use: Yes    Alcohol/week: 0.0 standard drinks   Drug use: Never   Sexual activity: Not Currently  Other Topics Concern   Not on file  Social History Narrative   Garden, Clinical research associatecrossstitch, shopping, 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     ALPRAZolam (XANAX) 0.25 MG tablet Take 1 tablet (0.25 mg total) by mouth 2 (two) times daily as needed. for anxiety 15 tablet 0   escitalopram (LEXAPRO) 20 MG tablet Take 1 tablet (20 mg total) by mouth daily. **NEEDS TRANSITION TO NEW PCP AND LABS FOR FURTHER REFILLS** 30 tablet 0   meloxicam (MOBIC) 15  MG tablet Take 0.5-1 tablets (7.5-15 mg total) by mouth daily. As needed for aches/pain 30 tablet 3   simvastatin (ZOCOR) 20 MG tablet TAKE 1 TABLET BY MOUTH ONCE DAILY NEEDS  TRANSITION  TO  NEW  PCP  AND  LABS  FOR  FURTHER  REFILLS 15 tablet 0   No facility-administered medications prior to visit.    No Known Allergies  ROS Review of Systems    Objective:    Physical Exam Constitutional:      Appearance: Normal appearance. She is well-developed.  HENT:     Head: Normocephalic and atraumatic.  Cardiovascular:     Rate and Rhythm: Normal rate and regular rhythm.     Heart sounds: Normal heart sounds.  Pulmonary:     Effort: Pulmonary effort is normal.     Breath sounds: Normal breath sounds.  Skin:    General: Skin is warm and dry.  Neurological:     Mental  Status: She is alert and oriented to person, place, and time.  Psychiatric:        Behavior: Behavior normal.    BP (!) 143/73 (BP Location: Right Arm)    Pulse 72    Ht 5\' 4"  (1.626 m)    Wt 221 lb (100.2 kg)    SpO2 97%    BMI 37.93 kg/m  Wt Readings from Last 3 Encounters:  04/15/21 221 lb (100.2 kg)  12/31/19 225 lb (102.1 kg)  11/28/19 226 lb (102.5 kg)     Health Maintenance Due  Topic Date Due   Hepatitis C Screening  Never done   Fecal DNA (Cologuard)  08/09/2020   COVID-19 Vaccine (6 - Booster for Pfizer series) 12/10/2020    There are no preventive care reminders to display for this patient.  Lab Results  Component Value Date   TSH 2.55 06/21/2017   Lab Results  Component Value Date   WBC 7.6 11/28/2019   HGB 14.1 11/28/2019   HCT 41.2 11/28/2019   MCV 88.0 11/28/2019   PLT 208 11/28/2019   Lab Results  Component Value Date   NA 138 11/28/2019   K 4.3 11/28/2019   CO2 27 11/28/2019   GLUCOSE 97 11/28/2019   BUN 16 11/28/2019   CREATININE 0.88 11/28/2019   BILITOT 0.7 11/28/2019   ALKPHOS 51 10/27/2014   AST 14 11/28/2019   ALT 10 11/28/2019   PROT 7.4 11/28/2019   ALBUMIN 4.4 10/27/2014   CALCIUM 9.6 11/28/2019   Lab Results  Component Value Date   CHOL 182 11/28/2019   Lab Results  Component Value Date   HDL 51 11/28/2019   Lab Results  Component Value Date   LDLCALC 105 (H) 11/28/2019   Lab Results  Component Value Date   TRIG 145 11/28/2019   Lab Results  Component Value Date   CHOLHDL 3.6 11/28/2019   Lab Results  Component Value Date   HGBA1C 5.7 (H) 06/21/2017      Assessment & Plan:   Problem List Items Addressed This Visit       Cardiovascular and Mediastinum   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  Encouraged her to keep an eye on this at home and can address again with her  future PCP in the meantime we will get some up-to-date blood work.      Relevant Medications   simvastatin (  ZOCOR) 20 MG tablet     Other   Hyperlipidemia - Primary (Chronic)    Lan to recheck lipids today.  Continue simvastatin.      Relevant Medications   simvastatin (ZOCOR) 20 MG tablet   Other Relevant Orders   Lipid Panel w/reflex Direct LDL   Anxiety (Chronic)    Discussed options.  I think some of what is currently going on her some of the acute stressors.  I do think she would really benefit from therapy or counseling she seemed open to it but did not want to place referral today until she meets with her new PCP but encouraged her to let me know if at any point she changes her mind we can always try to get her established with somebody at [facility].  In the meantime we did discuss a couple of options with her medication.  She is willing to try combo therapy.  So we will add Wellbutrin for the next 6 weeks to see if it is helpful if its not then we will taper it off and she can discuss switching medications completely with her PCP.  In the past she has also tried Paxil.      Relevant Medications   escitalopram (LEXAPRO) 20 MG tablet   ALPRAZolam (XANAX) 0.25 MG tablet   buPROPion (WELLBUTRIN XL) 150 MG 24 hr tablet   Other Visit Diagnoses     Thyroid disorder screen       Relevant Orders   TSH   Medication management       Relevant Orders   TSH   Lipid Panel w/reflex Direct LDL   COMPLETE METABOLIC PANEL WITH GFR   CBC with Differential/Platelet       Meds ordered this encounter  Medications   simvastatin (ZOCOR) 20 MG tablet    Sig: TAKE 1 TABLET BY MOUTH ONCE DAILY AT BEDTIME    Dispense:  90 tablet    Refill:  3   escitalopram (LEXAPRO) 20 MG tablet    Sig: Take 1 tablet (20 mg total) by mouth daily.    Dispense:  90 tablet    Refill:  0   ALPRAZolam (XANAX) 0.25 MG tablet    Sig: Take 1 tablet (0.25 mg total) by mouth daily as needed. for anxiety     Dispense:  15 tablet    Refill:  0   buPROPion (WELLBUTRIN XL) 150 MG 24 hr tablet    Sig: Take 1 tablet (150 mg total) by mouth daily.    Dispense:  90 tablet    Refill:  0    Follow-up: No follow-ups on file.   I spent 35 minutes on the day of the encounter to include pre-visit record review, face-to-face time with the patient and post visit ordering of test.   Nani Gasser, MD

## 2021-04-15 NOTE — Assessment & Plan Note (Signed)
Lan to recheck lipids today.  Continue simvastatin.

## 2021-04-15 NOTE — Assessment & Plan Note (Signed)
Discussed options.  I think some of what is currently going on her some of the acute stressors.  I do think she would really benefit from therapy or counseling she seemed open to it but did not want to place referral today until she meets with her new PCP but encouraged her to let me know if at any point she changes her mind we can always try to get her established with somebody at [facility].  In the meantime we did discuss a couple of options with her medication.  She is willing to try combo therapy.  So we will add Wellbutrin for the next 6 weeks to see if it is helpful if its not then we will taper it off and she can discuss switching medications completely with her PCP.  In the past she has also tried Paxil.

## 2021-04-16 ENCOUNTER — Other Ambulatory Visit: Payer: Self-pay

## 2021-04-16 DIAGNOSIS — Z1211 Encounter for screening for malignant neoplasm of colon: Secondary | ICD-10-CM

## 2021-04-16 LAB — CBC WITH DIFFERENTIAL/PLATELET
Absolute Monocytes: 518 cells/uL (ref 200–950)
Basophils Absolute: 50 cells/uL (ref 0–200)
Basophils Relative: 0.7 %
Eosinophils Absolute: 187 cells/uL (ref 15–500)
Eosinophils Relative: 2.6 %
HCT: 43.8 % (ref 35.0–45.0)
Hemoglobin: 14.5 g/dL (ref 11.7–15.5)
Lymphs Abs: 2398 cells/uL (ref 850–3900)
MCH: 29.8 pg (ref 27.0–33.0)
MCHC: 33.1 g/dL (ref 32.0–36.0)
MCV: 89.9 fL (ref 80.0–100.0)
MPV: 11.1 fL (ref 7.5–12.5)
Monocytes Relative: 7.2 %
Neutro Abs: 4046 cells/uL (ref 1500–7800)
Neutrophils Relative %: 56.2 %
Platelets: 191 10*3/uL (ref 140–400)
RBC: 4.87 10*6/uL (ref 3.80–5.10)
RDW: 13.8 % (ref 11.0–15.0)
Total Lymphocyte: 33.3 %
WBC: 7.2 10*3/uL (ref 3.8–10.8)

## 2021-04-16 LAB — COMPLETE METABOLIC PANEL WITH GFR
AG Ratio: 1.6 (calc) (ref 1.0–2.5)
ALT: 8 U/L (ref 6–29)
AST: 13 U/L (ref 10–35)
Albumin: 4.4 g/dL (ref 3.6–5.1)
Alkaline phosphatase (APISO): 63 U/L (ref 37–153)
BUN: 14 mg/dL (ref 7–25)
CO2: 29 mmol/L (ref 20–32)
Calcium: 9.5 mg/dL (ref 8.6–10.4)
Chloride: 104 mmol/L (ref 98–110)
Creat: 0.83 mg/dL (ref 0.60–1.00)
Globulin: 2.7 g/dL (calc) (ref 1.9–3.7)
Glucose, Bld: 101 mg/dL — ABNORMAL HIGH (ref 65–99)
Potassium: 4.2 mmol/L (ref 3.5–5.3)
Sodium: 140 mmol/L (ref 135–146)
Total Bilirubin: 0.7 mg/dL (ref 0.2–1.2)
Total Protein: 7.1 g/dL (ref 6.1–8.1)
eGFR: 74 mL/min/{1.73_m2} (ref >=60)

## 2021-04-16 LAB — LIPID PANEL W/REFLEX DIRECT LDL
Cholesterol: 172 mg/dL (ref <200)
HDL: 50 mg/dL (ref >=50)
LDL Cholesterol (Calc): 99 mg/dL (calc)
Non-HDL Cholesterol (Calc): 122 mg/dL (calc) (ref <130)
Total CHOL/HDL Ratio: 3.4 (calc) (ref <5.0)
Triglycerides: 124 mg/dL (ref <150)

## 2021-04-16 LAB — TSH: TSH: 4.38 mIU/L (ref 0.40–4.50)

## 2021-04-16 NOTE — Progress Notes (Signed)
Ordered Cologuard per results.

## 2021-04-16 NOTE — Progress Notes (Signed)
Hi Candace Williams.  Your metabolic panel overall looks good.  Cholesterol looks great as well.  In fact it looks the best it has in a couple of years which is awesome!  Your blood count is normal no sign of anemia.  Thyroid is also normal but it is on the higher end of normal so would definitely recommend having your new PCP recheck your thyroid in about 6 to 12 months.  Also if you are interested in doing colon cancer screening we can always have a Cologuard kit sent to your house where you can provide a specimen.  The test is good for 3 years.  And that result can always be forwarded to your new PCP as well.  Just let us know if we can be of assistance.

## 2021-05-05 LAB — COLOGUARD: COLOGUARD: POSITIVE — AB

## 2021-05-05 NOTE — Progress Notes (Signed)
Please call patient and let her know that her Cologuard did come back positive this time so the current recommendation and guidelines is for her to have a screening colonoscopy.  Please let us know where she has a preference or location and we can place the referral.  Also have her let us know if she has received her shingles vaccine at the pharmacy.

## 2022-04-08 ENCOUNTER — Other Ambulatory Visit: Payer: Self-pay | Admitting: Family Medicine

## 2022-04-08 DIAGNOSIS — E785 Hyperlipidemia, unspecified: Secondary | ICD-10-CM

## 2022-04-08 NOTE — Telephone Encounter (Signed)
Pt will need to establish care with a pcp. Dr. Madilyn Fireman is NOT her pcp. Please call her and advise her that she will need to do this for medication refills.

## 2022-04-11 NOTE — Telephone Encounter (Signed)
Lvm for patient to schedule an appointment to est. care with a PCP in order to get med refills. Tvt

## 2022-06-23 ENCOUNTER — Other Ambulatory Visit: Payer: Self-pay | Admitting: Family Medicine

## 2022-06-23 DIAGNOSIS — E785 Hyperlipidemia, unspecified: Secondary | ICD-10-CM

## 2022-07-03 ENCOUNTER — Other Ambulatory Visit: Payer: Self-pay | Admitting: Family Medicine

## 2022-07-03 DIAGNOSIS — E785 Hyperlipidemia, unspecified: Secondary | ICD-10-CM

## 2022-08-08 IMAGING — MG DIGITAL SCREENING BILAT W/ TOMO W/ CAD
6 of 12 series · 6 of 36 positions shown · non-contrast
Comparison: None.
COMPARISON: None.

Addendum:
CLINICAL DATA: Screening.

EXAM:
DIGITAL SCREENING BILATERAL MAMMOGRAM WITH TOMO AND CAD

[R XCCL synth-2D]
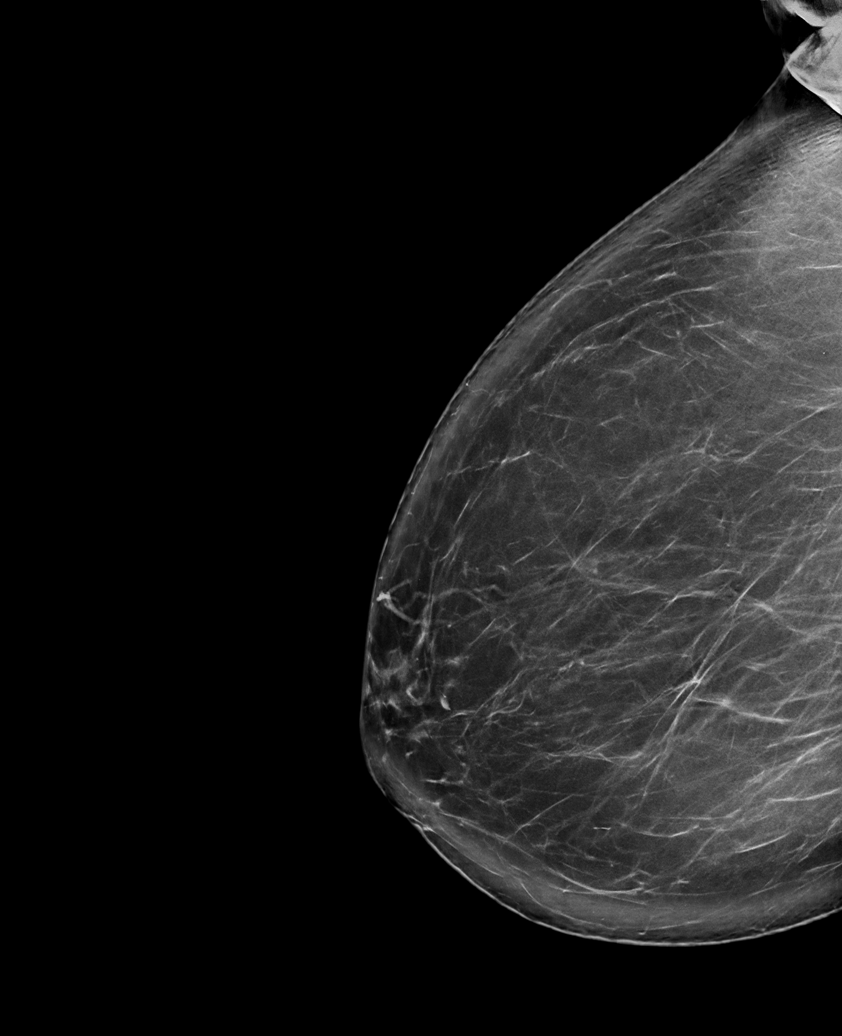

[R MLO synth-2D (1 of 2)]
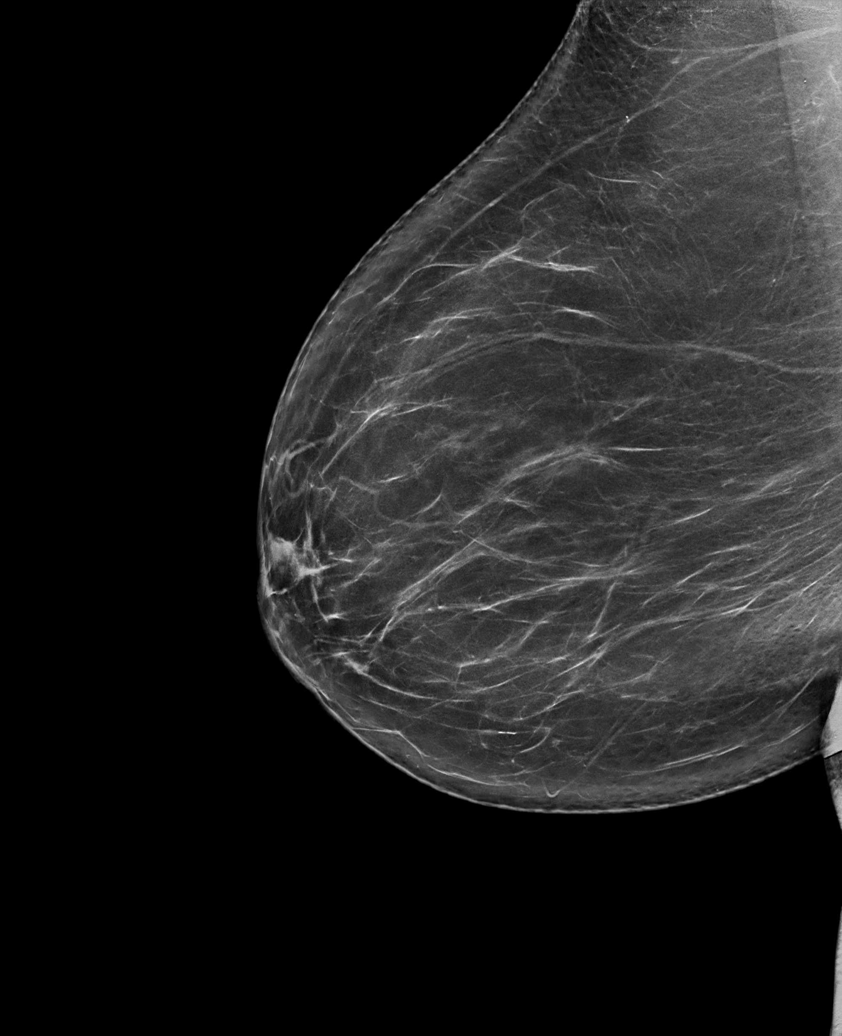

[R MLO synth-2D (2 of 2)]
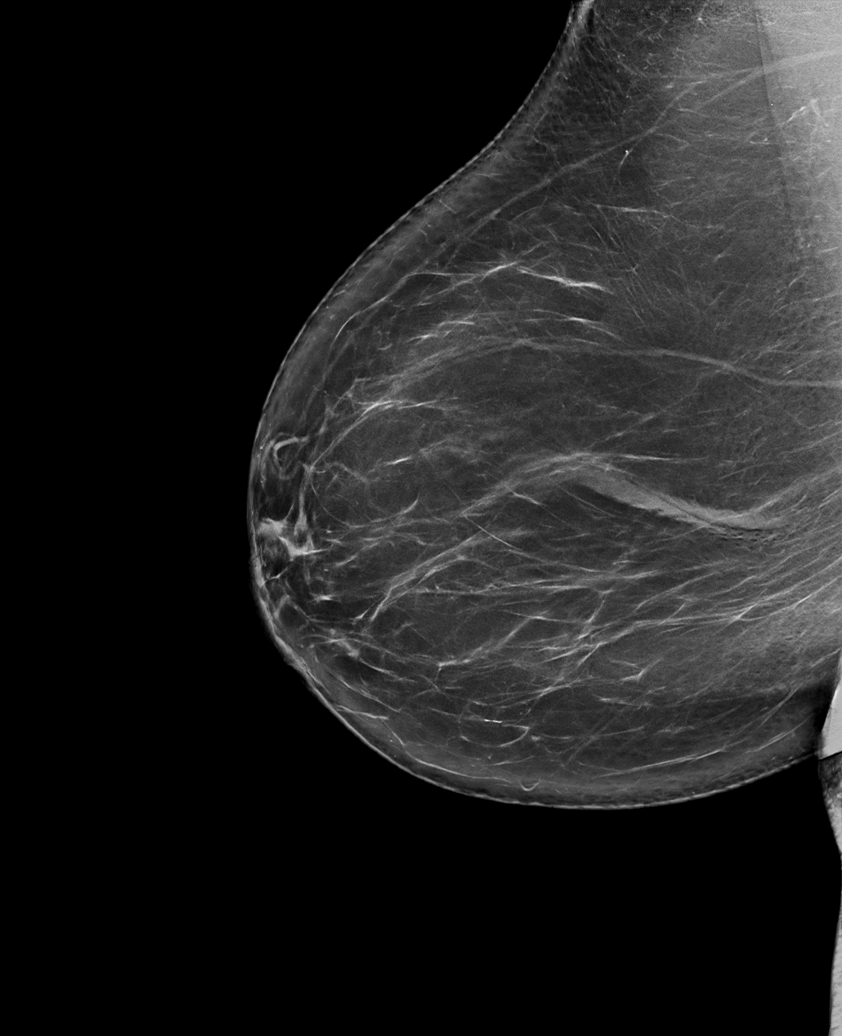

[R CC synth-2D]
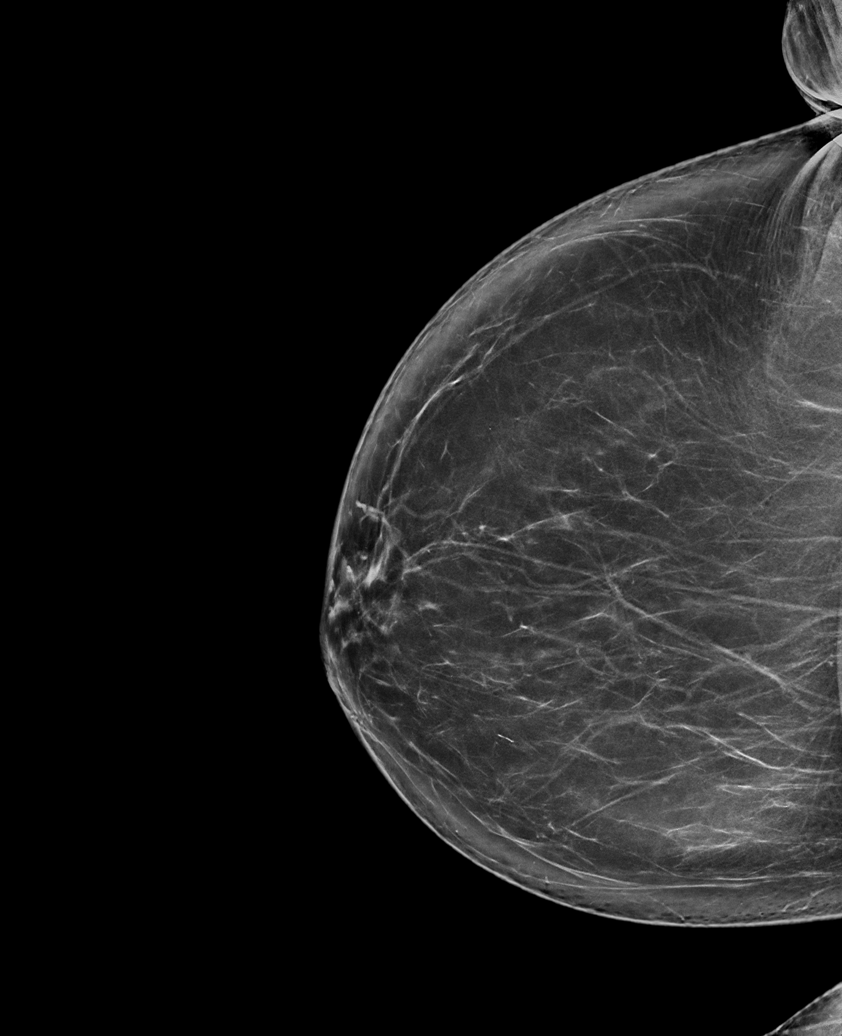

[L CC synth-2D]
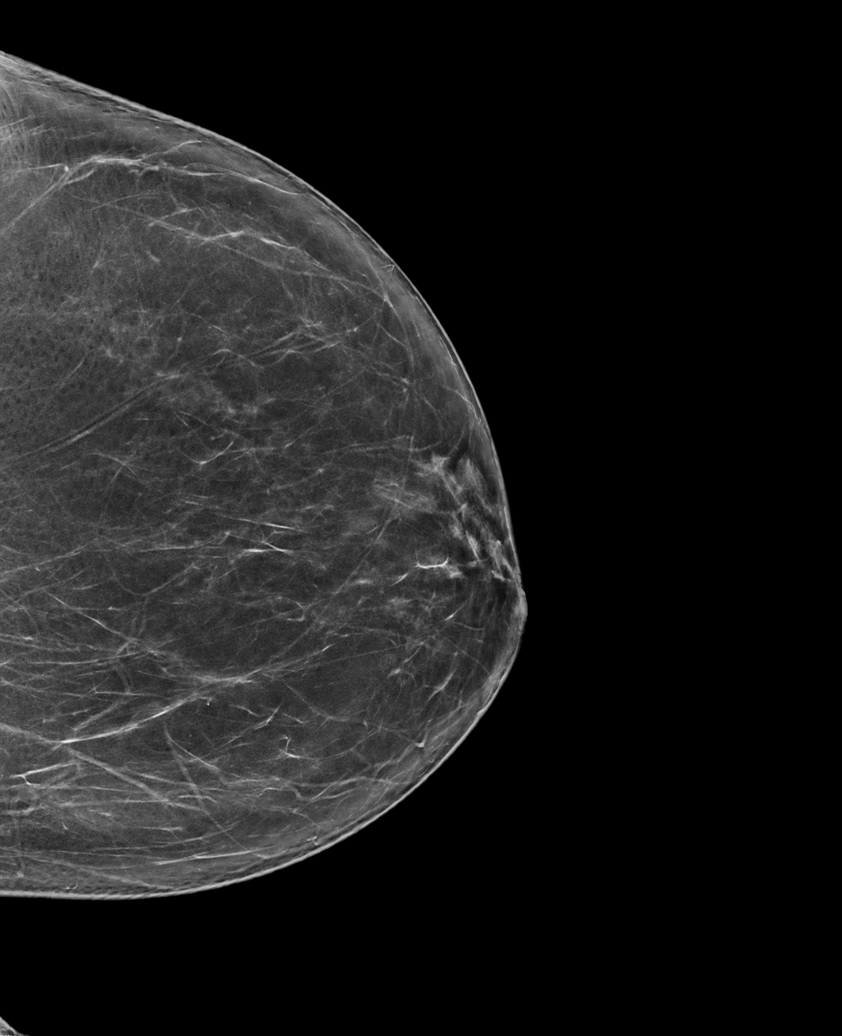

[L MLO synth-2D]
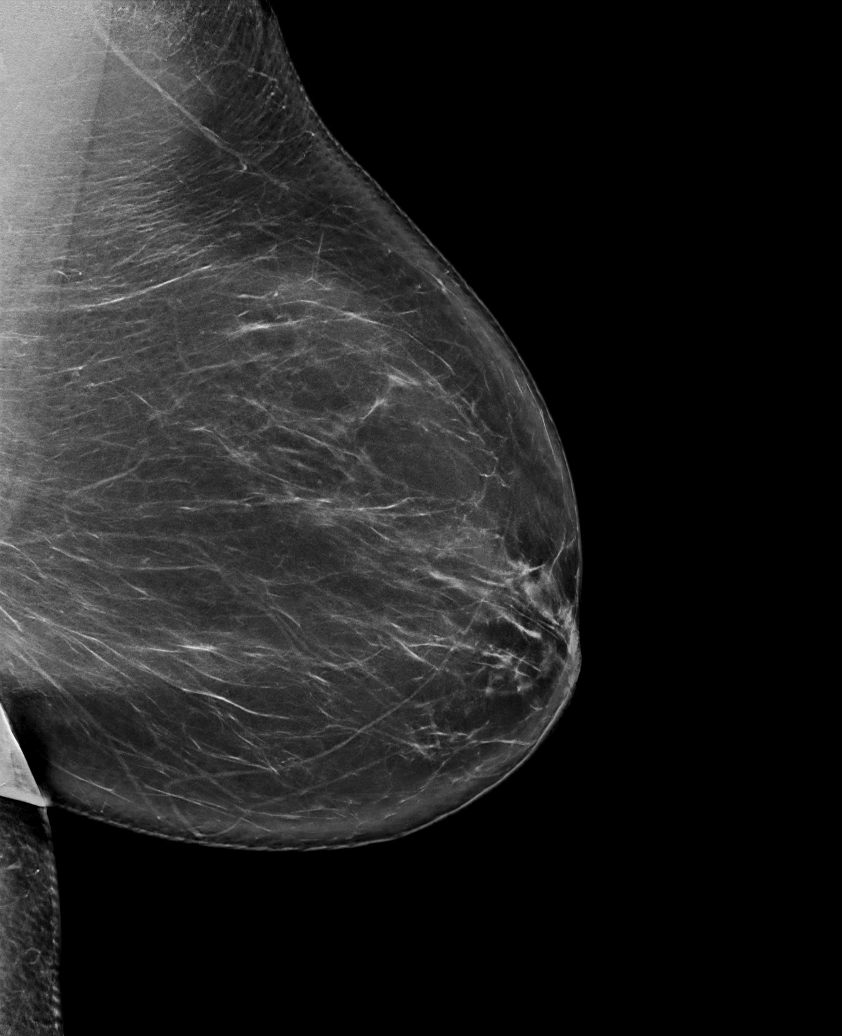

[6 of 36 positions shown; findings below may reference images not displayed]

The patient's prior examinations from [place]
have been requested several times but have not yet become available
for comparison, accounting for the delay in this report.

ACR Breast Density Category a: The breast tissue is almost entirely
fatty.
FINDINGS: There are no findings suspicious for malignancy. Images were
processed with CAD.
IMPRESSION: No mammographic evidence of malignancy. A result letter of this
screening mammogram will be mailed directly to the patient.

RECOMMENDATION:
Screening mammogram in one year. (Code:GI-8-88N)

BI-RADS CATEGORY  1: Negative.

ADDENDUM:
I now have prior mammograms dated 07/28/2008 from [REDACTED][place], [place]. The current mammogram is stable
when compared to that examination, and the current report needs no
other revision.

*** End of Addendum ***
The patient's prior examinations from [place]
have been requested several times but have not yet become available
for comparison, accounting for the delay in this report.

ACR Breast Density Category a: The breast tissue is almost entirely
fatty.
FINDINGS: There are no findings suspicious for malignancy. Images were
processed with CAD.
IMPRESSION: No mammographic evidence of malignancy. A result letter of this
screening mammogram will be mailed directly to the patient.

RECOMMENDATION:
Screening mammogram in one year. (Code:GI-8-88N)

BI-RADS CATEGORY  1: Negative.
